# Patient Record
Sex: Female | Born: 1945 | Race: White | Hispanic: No | Marital: Married | Smoking: Never smoker
Health system: Southern US, Community
[De-identification: ages and names within clinical notes are randomized; demographics above are authoritative.]

## PROBLEM LIST (undated history)

## (undated) DIAGNOSIS — Z951 Presence of aortocoronary bypass graft: Secondary | ICD-10-CM

## (undated) DIAGNOSIS — E785 Hyperlipidemia, unspecified: Secondary | ICD-10-CM

## (undated) DIAGNOSIS — I10 Essential (primary) hypertension: Secondary | ICD-10-CM

## (undated) DIAGNOSIS — Z79899 Other long term (current) drug therapy: Secondary | ICD-10-CM

## (undated) DIAGNOSIS — R600 Localized edema: Secondary | ICD-10-CM

## (undated) DIAGNOSIS — I2510 Atherosclerotic heart disease of native coronary artery without angina pectoris: Secondary | ICD-10-CM

## (undated) DIAGNOSIS — Z8679 Personal history of other diseases of the circulatory system: Secondary | ICD-10-CM

## (undated) DIAGNOSIS — I25810 Atherosclerosis of coronary artery bypass graft(s) without angina pectoris: Secondary | ICD-10-CM

## (undated) DIAGNOSIS — G4733 Obstructive sleep apnea (adult) (pediatric): Secondary | ICD-10-CM

## (undated) HISTORY — DX: Atherosclerotic heart disease of native coronary artery without angina pectoris: I25.10

## (undated) HISTORY — DX: Personal history of other diseases of the circulatory system: Z86.79

## (undated) HISTORY — DX: Essential (primary) hypertension: I10

## (undated) HISTORY — DX: Obstructive sleep apnea (adult) (pediatric): G47.33

## (undated) HISTORY — DX: Localized edema: R60.0

## (undated) HISTORY — DX: Other long term (current) drug therapy: Z79.899

## (undated) HISTORY — DX: Atherosclerosis of coronary artery bypass graft(s) without angina pectoris: I25.810

## (undated) HISTORY — DX: Presence of aortocoronary bypass graft: Z95.1

## (undated) HISTORY — DX: Hyperlipidemia, unspecified: E78.5

---

## 1997-07-09 ENCOUNTER — Inpatient Hospital Stay (HOSPITAL_COMMUNITY): Admission: EM | Admit: 1997-07-09 | Discharge: 1997-07-11 | Payer: Self-pay | Admitting: Emergency Medicine

## 1997-09-11 ENCOUNTER — Ambulatory Visit (HOSPITAL_COMMUNITY): Admission: RE | Admit: 1997-09-11 | Discharge: 1997-09-11 | Payer: Self-pay | Admitting: Family Medicine

## 1997-09-23 ENCOUNTER — Ambulatory Visit (HOSPITAL_COMMUNITY): Admission: RE | Admit: 1997-09-23 | Discharge: 1997-09-23 | Payer: Self-pay | Admitting: Family Medicine

## 1997-10-04 ENCOUNTER — Ambulatory Visit (HOSPITAL_COMMUNITY): Admission: RE | Admit: 1997-10-04 | Discharge: 1997-10-04 | Payer: Self-pay | Admitting: Surgery

## 1998-03-28 ENCOUNTER — Encounter: Payer: Self-pay | Admitting: Family Medicine

## 1998-03-28 ENCOUNTER — Ambulatory Visit (HOSPITAL_COMMUNITY): Admission: RE | Admit: 1998-03-28 | Discharge: 1998-03-28 | Payer: Self-pay | Admitting: Family Medicine

## 1998-09-20 ENCOUNTER — Encounter: Payer: Self-pay | Admitting: Family Medicine

## 1998-09-20 ENCOUNTER — Ambulatory Visit (HOSPITAL_COMMUNITY): Admission: RE | Admit: 1998-09-20 | Discharge: 1998-09-20 | Payer: Self-pay | Admitting: Family Medicine

## 2000-09-16 ENCOUNTER — Other Ambulatory Visit: Admission: RE | Admit: 2000-09-16 | Discharge: 2000-09-16 | Payer: Self-pay | Admitting: Family Medicine

## 2000-10-06 ENCOUNTER — Encounter: Payer: Self-pay | Admitting: Family Medicine

## 2000-10-06 ENCOUNTER — Encounter: Admission: RE | Admit: 2000-10-06 | Discharge: 2000-10-06 | Payer: Self-pay | Admitting: *Deleted

## 2001-03-08 DIAGNOSIS — Z8679 Personal history of other diseases of the circulatory system: Secondary | ICD-10-CM

## 2001-03-08 DIAGNOSIS — I2510 Atherosclerotic heart disease of native coronary artery without angina pectoris: Secondary | ICD-10-CM

## 2001-03-08 HISTORY — DX: Atherosclerotic heart disease of native coronary artery without angina pectoris: I25.10

## 2001-03-08 HISTORY — DX: Personal history of other diseases of the circulatory system: Z86.79

## 2001-08-11 ENCOUNTER — Encounter: Payer: Self-pay | Admitting: Emergency Medicine

## 2001-08-12 ENCOUNTER — Inpatient Hospital Stay (HOSPITAL_COMMUNITY): Admission: EM | Admit: 2001-08-12 | Discharge: 2001-08-12 | Payer: Self-pay | Admitting: Emergency Medicine

## 2001-11-01 ENCOUNTER — Encounter: Admission: RE | Admit: 2001-11-01 | Discharge: 2001-11-01 | Payer: Self-pay | Admitting: Family Medicine

## 2001-11-01 ENCOUNTER — Encounter: Payer: Self-pay | Admitting: Family Medicine

## 2001-11-13 ENCOUNTER — Inpatient Hospital Stay (HOSPITAL_COMMUNITY): Admission: AD | Admit: 2001-11-13 | Discharge: 2001-11-19 | Payer: Self-pay | Admitting: Cardiology

## 2001-11-14 ENCOUNTER — Encounter: Payer: Self-pay | Admitting: Cardiothoracic Surgery

## 2001-11-15 ENCOUNTER — Encounter: Payer: Self-pay | Admitting: Cardiothoracic Surgery

## 2001-11-16 ENCOUNTER — Encounter: Payer: Self-pay | Admitting: Cardiothoracic Surgery

## 2001-11-17 ENCOUNTER — Encounter: Payer: Self-pay | Admitting: Cardiothoracic Surgery

## 2001-11-18 DIAGNOSIS — Z951 Presence of aortocoronary bypass graft: Secondary | ICD-10-CM

## 2001-11-18 HISTORY — DX: Presence of aortocoronary bypass graft: Z95.1

## 2001-11-19 ENCOUNTER — Encounter: Payer: Self-pay | Admitting: Cardiothoracic Surgery

## 2001-12-15 ENCOUNTER — Encounter: Admission: RE | Admit: 2001-12-15 | Discharge: 2001-12-15 | Payer: Self-pay | Admitting: Cardiothoracic Surgery

## 2001-12-15 ENCOUNTER — Encounter: Payer: Self-pay | Admitting: Cardiothoracic Surgery

## 2001-12-25 ENCOUNTER — Encounter (HOSPITAL_COMMUNITY): Admission: RE | Admit: 2001-12-25 | Discharge: 2002-03-25 | Payer: Self-pay | Admitting: Cardiology

## 2002-01-06 HISTORY — PX: CARDIAC CATHETERIZATION: SHX172

## 2002-01-06 HISTORY — PX: CORONARY ARTERY BYPASS GRAFT: SHX141

## 2002-07-19 ENCOUNTER — Ambulatory Visit (HOSPITAL_COMMUNITY): Admission: RE | Admit: 2002-07-19 | Discharge: 2002-07-19 | Payer: Self-pay | Admitting: Cardiology

## 2002-07-19 ENCOUNTER — Encounter: Payer: Self-pay | Admitting: Cardiology

## 2004-04-03 ENCOUNTER — Encounter: Admission: RE | Admit: 2004-04-03 | Discharge: 2004-04-03 | Payer: Self-pay | Admitting: Family Medicine

## 2005-02-22 ENCOUNTER — Other Ambulatory Visit: Admission: RE | Admit: 2005-02-22 | Discharge: 2005-02-22 | Payer: Self-pay | Admitting: Family Medicine

## 2005-08-28 ENCOUNTER — Emergency Department (HOSPITAL_COMMUNITY): Admission: EM | Admit: 2005-08-28 | Discharge: 2005-08-28 | Payer: Self-pay | Admitting: Emergency Medicine

## 2005-10-05 ENCOUNTER — Encounter: Admission: RE | Admit: 2005-10-05 | Discharge: 2005-10-05 | Payer: Self-pay | Admitting: Family Medicine

## 2005-12-09 ENCOUNTER — Encounter: Admission: RE | Admit: 2005-12-09 | Discharge: 2005-12-09 | Payer: Self-pay | Admitting: Family Medicine

## 2005-12-15 ENCOUNTER — Encounter: Admission: RE | Admit: 2005-12-15 | Discharge: 2005-12-15 | Payer: Self-pay | Admitting: Cardiology

## 2006-04-12 ENCOUNTER — Other Ambulatory Visit: Admission: RE | Admit: 2006-04-12 | Discharge: 2006-04-12 | Payer: Self-pay | Admitting: Family Medicine

## 2006-10-23 ENCOUNTER — Observation Stay (HOSPITAL_COMMUNITY): Admission: EM | Admit: 2006-10-23 | Discharge: 2006-10-25 | Payer: Self-pay | Admitting: Emergency Medicine

## 2006-11-29 ENCOUNTER — Encounter: Admission: RE | Admit: 2006-11-29 | Discharge: 2006-11-29 | Payer: Self-pay | Admitting: Family Medicine

## 2006-12-04 ENCOUNTER — Emergency Department (HOSPITAL_COMMUNITY): Admission: EM | Admit: 2006-12-04 | Discharge: 2006-12-04 | Payer: Self-pay | Admitting: Emergency Medicine

## 2007-04-14 ENCOUNTER — Encounter: Admission: RE | Admit: 2007-04-14 | Discharge: 2007-04-14 | Payer: Self-pay | Admitting: Family Medicine

## 2009-05-06 ENCOUNTER — Other Ambulatory Visit: Admission: RE | Admit: 2009-05-06 | Discharge: 2009-05-06 | Payer: Self-pay | Admitting: Family Medicine

## 2010-07-21 NOTE — Cardiovascular Report (Signed)
[name], [name]                  ACCOUNT NO.:  [id number]   MEDICAL RECORD NO.:  [id number]          PATIENT TYPE:  INP   LOCATION:  2006                         FACILITY:  [facility]   PHYSICIAN:  Ritta Slot, MD     DATE OF BIRTH:  18-Jul-1945   DATE OF PROCEDURE:  10/24/2006  DATE OF DISCHARGE:                            CARDIAC CATHETERIZATION   PROCEDURE PERFORMED:  1. Selective coronary angiography of the native coronary circulation      by the Judkins technique.  2. Retrograde left heart catheterization.  3. Left ventricular angiography.  4. Selective visualization of multiple saphenous vein grafts.  5. Selective visualization of the internal mammary artery.   COMPLICATIONS:  None.   ENTRY SITE:  Right femoral artery.   DYE USED:  Omnipaque.   PATIENT PROFILE:  This is a pleasant, 65 year old Caucasian female who  has a known history of coronary artery disease status post CABG in 2003  for the LIMA to LAD, vein graft to a diagonal,  vein graft to OM, who  comes in now complaining of chest pain over the weekend, that was  thought to be indigestion without any benefit with Tums.  It was felt to  proceed directly with cardiac catheterization for further evaluation of  the coronary artery.   RESULTS:  1. Pressure.  The left ventricular pressure was 147/21.  The aortic      pressure was 160/86.  There was no pullback gradient across the      aortic valve.  2. Angiographic results.      a.     The left main coronary artery appears normal.  The left       anterior descending artery contains a 90% stenosis in the proximal       portion of the vessel at the bifurcation of the first diagonal.       There is a TIMI-3 flow down the vessel and down the diagonal.      b.     The LIMA to the mid LAD is patent, with good flow distally,       and no evidence of any stenosis.      c.     The circumflex artery contains obtuse marginals with 100%       occlusion proximally.  There is a  vein graft to the distal       circumflex which supplies the circumflex and obtuse marginal       arteries, with good flow.  There is a 100% blocked saphenous vein       graft to the diagonal at the origin of the saphenous vein graft.       The right coronary artery is patent and free of disease, and is a       dominant vessel.  3. There is a left internal mammary artery graft that is widely patent      throughout.  It fills the LAD with good distal flow to the LAD.      The left ventricle shows mildly hypokinetic left ventricular  ejection fraction with an EF of around 40-50%, as well as a 2+ MR.   FINAL DIAGNOSIS:  Coronary artery disease.  Saphenous vein graft is  functional, with one saphenous vein graft to the diagonal occluded which  has been known since 2004, patent saphenous vein graft to an obtuse  marginal and  patent left internal mammary artery to the left anterior descending  artery, patent native right coronary artery, mild left ventricular  dysfunction with an ejection fraction of around 45-50%, 2+ mitral  regurgitation.  It is felt that this patient's chest pain is noncardiac  on this admission, and therefore she is to go home for bed rest.      Ritta Slot, MD  Electronically Signed     HS/MEDQ  D:  10/24/2006  T:  10/25/2006  Job:  [id number]

## 2010-07-21 NOTE — Discharge Summary (Signed)
NAMEKATHERENE, [name]                  ACCOUNT NO.:  [id number]   MEDICAL RECORD NO.:  [id number]          PATIENT TYPE:  INP   LOCATION:  2006                         FACILITY:  [facility]   PHYSICIAN:  Madaline Savage, M.D.DATE OF BIRTH:  03-26-1945   DATE OF ADMISSION:  10/23/2006  DATE OF DISCHARGE:  10/25/2006                               DISCHARGE SUMMARY   FINAL DIAGNOSES:  1. Chest pain, noncardiac.  2. Patent saphenous vein graft to obtuse marginal, patent LIMA to the      LAD, and patent native right coronary artery.  3. Saphenous vein graft to the diagonal was occluded which is      unchanged since 2004.  4. Decreased left ventricular function with ejection fraction 45 to      50%.  5. Two+ mitral regurgitation.  6. Hypertension.  7. Dyslipidemia.  8. Migraine headache.  9. Family history of coronary artery disease.   PROCEDURES:  Left heart cardiac catheterization and coronary arteriogram  were performed on October 23, 2006 by Dr. Ritta Slot.  It was noted  that one saphenous vein graft to the diagonal branch was occluded which  was unchanged since 2004.  There was a patent saphenous vein graft to an  obtuse marginal branch and patent left internal mammary artery to the  LAD.  The right coronary artery was patent as well.  LV function was  mildly reduced at 45 to 50% and there was 2+ mitral regurgitation.   COMPLICATIONS:  None.   BRIEF HISTORY:  [name] is a 65 year old female who presented to the  emergency department at [facility] on October 23, 2006 with complaint of  chest pain and burning; it was unrelieved with Tums; she did get mild  relief with sublingual nitroglycerin.  She was admitted with unstable  angina.  She ruled out for myocardial infarction.  Cardiac markers were  negative.  On October 24, 2006, she was taken to the cardiac  catheterization suite for left heart catheterization and coronary  arteriogram as well as visualization of grafts.  She  tolerated the  procedure without complications; thus, was returned to 2000.  She  experienced some bruising to the right groin and some mild bleeding to  the right groin and pressure was held to control that.  Her bedrest  would be complete at 10:00 p.m. and; thus, it was felt best to keep her  overnight and she was agreeable to that.  This morning, she is doing  well with no complaint and she is anxious for discharge.   DISCHARGE INSTRUCTIONS:  1. She is to keep the right groin site clean and dry and contact us      with bleeding, redness, swelling or edema at the site.  2. She may increase her activities slowly and she may shower later      today.  3. She is to avoid heavy lifting for one week and no driving for the      next 24 hours.   FOLLOWUP APPOINTMENTS:  She is to return to see Dr. Elsie Lincoln in four  months; our office will contact her with the specific date and time for  her appointment.   DISCHARGE MEDICATIONS:  1. Zocor 40 mg daily.  2. Lisinopril 10 mg daily.  3. Plavix 75 mg daily.  4. Imdur 30 mg daily.  5. Fish oil 1200 mg b.i.d.  6. Folic acid 400 mg two daily.  7. Toprol XL 50 mg daily.  8. Aspirin 81 mg daily.  9. Multivitamin daily.     ______________________________  Charmian Muff, NP    ______________________________  Madaline Savage, M.D.    LS/MEDQ  D:  10/25/2006  T:  10/25/2006  Job:  [id number]   cc:   [facility]  L. Lupe Carney, M.D.

## 2010-07-24 NOTE — Discharge Summary (Signed)
NAMEJEANET, [name] NO.:  [id number]   MEDICAL RECORD NO.:  [id number]                   PATIENT TYPE:  INP   LOCATION:  2040                                 FACILITY:  [facility]   PHYSICIAN:  Kerin Perna, M.D.               DATE OF BIRTH:  23-Feb-1946   DATE OF ADMISSION:  11/13/2001  DATE OF DISCHARGE:  11/19/2001                                 DISCHARGE SUMMARY   ADMISSION DIAGNOSIS:  Coronary artery disease with unstable angina.   PAST MEDICAL HISTORY:  1. History of chest pain, status post cardiac catheterization in 1999 which     revealed minimal coronary artery disease.  2. Hypertension.  3. Hyperlipidemia.  4. Mild obesity.   PAST SURGICAL HISTORY:  Carpal tunnel repair and tubal ligation.   ALLERGIES:  MOTRIN causes her throat to close.   DISCHARGE DIAGNOSIS:  Coronary artery disease with unstable angina, status  post coronary artery bypass graft.   HISTORY OF PRESENT ILLNESS:  The patient is a 65 year old Caucasian female.  In July of 2003, she sought medical attention because of a two to three-  month history of fatigue and chest pain.  At that time, it was thought that  her chest pain might be due to some costochondritis and she was treated with  NSAIDs.  Pain improved slightly, but did not completely resolve.  She then  underwent a Cardiolite stress test which was positive Bruce protocol and she  also had some chest pain during the walking phase.  She followed up with  [facility] on September 2.  She was still  experiencing some chest pain.  They recommended cardiac catheterization  which was performed on September 8, at [facility].  This  catheterization revealed two vessel coronary artery disease including a 70%  ostial left main lesion, and an occluded right coronary artery.  Dr. Elsie Lincoln  recommended transferring to [facility] for further treatment.   HOSPITAL COURSE:  On  September 8, the patient was admitted to [facility] under the care of Dr. Elsie Lincoln.  He requested CVTS consultation.  The  patient was evaluated by Dr. Donata Clay, after examination of the the patient  and review of the records including cardiac catheterization forms, Dr. Donata Clay recommended coronary artery bypass grafting for treatment.  The  procedure, risks, and benefits were discussed with the patient and she  agreed to proceed.   Doppler studies were performed on September 8, which revealed no significant  carotid artery disease and she had palpable pedal pulses bilaterally.   On September 9, the patient underwent an uncomplicated coronary artery  bypass grafting x3 by Dr. Donata Clay.  Grafts at the time of the procedure;  left internal mammary artery grafted to the left anterior descending artery,  saphenous vein grafted to the obtuse marginal artery, saphenous  vein grafted  to the diagonal artery. Vein was harvested from the right leg via the  Endovenous Harvesting technique.  She tolerated this procedure reasonably  well and was transferred in stable condition to the SICU.   The patient has remained hemodynamically stable since surgery and her  postoperative course has been uneventful.   The morning of September 15, she reports that she feels much better today.  Her vital signs are stable and she is afebrile.  Her heart has been  maintaining normal sinus rhythm.  Her lungs are clear.  Her bowel and  bladder function is within normal limits for her.  She is tolerating a  regular diet with no nausea.  Her incisions are all healing well and she has  no lower extremity edema.  Her weight is still several pounds over her  admission weight.  She is ambulating well in the hallway and her pain is  well controlled.  It is anticipated that if she continues to make good  progress in the next 24 hours, she will be ready for discharge tomorrow  morning, November 19, 2001.    CONDITION ON DISCHARGE:  Improved.   DISCHARGE INSTRUCTIONS:  She has been asked to refrain from any driving or  any heavy lifting, pushing, or pulling.  She has also been asked to continue  her breathing exercises and daily walking.  Diet should be low fat, low salt  diet.   WOUND CARE:  She may shower with mild soap and water.  She has been asked to  inspect her incisions daily.  If they become red, hot, swollen, draining, or  if she has a fever greater than 101 degrees Fahrenheit, she is to call Dr.  Zenaida Niece Trigt's office.   DISCHARGE MEDICATIONS:  1. Ultram 50 mg one to two p.o. q.4-6h. p.r.n. for pain.  2. Lasix 40 mg p.o. q.d. x10 days.  3. Potassium chloride 20 mEq p.o. q.d. x10 days.  4. Inderal LA 80 mg p.o. q.d.  5. Lipitor 20 mg p.o. q.d.  6. Enteric-coated aspirin 325 mg p.o. q.d.   FOLLOW UP:  1. Dr. Elsie Lincoln would like to see her in his office in approximately two     weeks.  She has been asked to call [facility]     for an appointment.  2. She will see Dr. Donata Clay at the CVTS Office in approximately three     weeks.  The office will call with date and time for that appointment.     She will be asked to have a chest x-ray at [facility] one hour prior to that     appointment.     Toribio Harbour, R.N.                  Kerin Perna, M.D.    CTK/MEDQ  D:  11/18/2001  T:  11/20/2001  Job:  [id number]   cc:   L. [name] Carney, M.D.   Madaline Savage, M.D.  [address], [address]  [place]  [place] [address]  Fax: [phone]

## 2010-07-24 NOTE — Op Note (Signed)
[name], [name] NO.:  [id number]   MEDICAL RECORD NO.:  [id number]                   PATIENT TYPE:  INP   LOCATION:  2303                                 FACILITY:  [facility]   PHYSICIAN:  Kerin Perna III, M.D.           DATE OF BIRTH:  1945-10-23   DATE OF PROCEDURE:  11/14/2001  DATE OF DISCHARGE:                                 OPERATIVE REPORT   PREOPERATIVE DIAGNOSES:  Class IV unstable angina with left main and two-  vessel coronary disease.   POSTOPERATIVE DIAGNOSES:  Class IV unstable angina with left main and two-  vessel coronary disease.   OPERATION PERFORMED:  Coronary artery bypass grafting times three (left  internal mammary artery to the left anterior descending, saphenous vein  graft to diagonal, saphenous vein graft to circumflex marginal).   SURGEON:  Kerin Perna, M.D.   ASSISTANT:  Levin Erp. Steward, P.A.   ANESTHESIA:  General.   ANESTHESIOLOGIST:  Edwin Cap. Zoila Shutter, M.D.   INDICATIONS FOR PROCEDURE:  The patient is a 65 year old female with a  history of chest pain.  She had been evaluated by Cardiolite scan which was  nonconclusive and had persistent chest pain.  She underwent cardiac  catheterization by Dr. Elsie Lincoln which demonstrated ostial 75% stenosis and  total occlusion of the distal circumflex marginal.  She was felt to be a  candidate for surgical coronary revascularization.  Prior to surgery, I  examined the patient in her hospital room in the CCU following cardiac  catheterization.  I discussed with the patient the indications and expected  benefits of coronary artery bypass surgery for treatment of her coronary  artery disease.  I reviewed with the patient the alternatives to surgical  therapy for treatment of her coronary artery disease.  I discussed with the  patient the major aspects of the proposed operation including the location  of the surgical incisions, the choice of conduit for grafting, the  use of  general anesthesia and cardiopulmonary bypass, and the expected  postoperative hospital recovery.  I discussed with the patient the risks to  her coronary artery bypass surgery including the risks of myocardial  infarction, cerebrovascular accident, bleeding, infection, blood transfusion  requirement and death.  She understood these implications for the surgery  and agreed to proceed with the operation as planned under what I felt was an  informed consent.   OPERATIVE FINDINGS:  The patient's mammary artery is very small and had to  be shortened in order to be usable.  It was placed to the proximal mid LAD.  The saphenous vein was harvested from the right thigh using endoscopic vein  technique.  The diagonal was a small vessel with proximal disease.  The  circumflex marginal was a 1.5 mm vessel that was chronically occluded.  Overall ventricular function looked good.  Due to the patient 's body  habitus.  Exposure of the posterior aspect of the heart was difficult.   DESCRIPTION OF PROCEDURE:  The patient was brought to the operating room and  placed supine on the operating room table where general anesthesia was  induced under invasive hemodynamic monitoring.  The chest, abdomen and legs  were prepped with Betadine and draped as a sterile field.  A sternal  incision was made and the saphenous vein was harvested from the right thigh  using endoscopic technique.  The left internal mammary artery was harvested  as a pedicle graft from its origin at the subclavian vessels.  Heparin was  administered and the ACT was documented as being therapeutic.  Through  pursestrings placed in the ascending aorta and right atrium, the patient was  cannulated, placed on bypass and cooled to 32 degrees.  The coronaries were  identified for grafting and the mammary artery and vein grafts were prepared  for the distal anastomoses.  A cardioplegia cannula was placed and the  patient was cooled to 30  degrees.  As the aortic crossclamp was applied, 500  cc of cold blood cardioplegia was delivered to the aortic root with good  cardioplegic arrest and septal temperature dropping less than 12 degrees.  Topical iced saline slush was used to augment myocardial preservation and a  pericardial insulator pad was used to protect the left phrenic nerve.   The distal coronary anastomoses were then performed.  The first distal  anastomosis was to the high first diagonal.  This was a  1.2 mm vessel with proximal 75 to 80% stenosis and a reversed saphenous vein  was sewn end-to-side with a running 7-0 Prolene.  The second distal  anastomosis was to the occluded circumflex marginal.  This was a 1.5 mm  vessel and the reversed saphenous vein was sewn end-to-side with running 7-0  Prolene.  Cardioplegia was redosed.  The third distal anastomosis was to the  midportion of the LAD, which was a 1.5 mm vessel with proximal 80% stenosis  at the left main.  The left internal mammary artery pedicle was brought  through an opening created in the left lateral pericardium and was brought  down onto the LAD and sewn end-to-side with running 8-0 Prolene.  There was  good flow through the anastomosis with immediate rise in septal temperature  after release of the pedicle clamp on the mammary artery.  The mammary  pedicle was secured to the epicardium and the aortic crossclamp was removed.   The heart resumed a spontaneous rhythm.  Using a partial occlusion clamp,  two proximal vein anastomoses were placed on the ascending aorta using a 4.0  mm punch and running 6-0 Prolene.  The partial clamp was removed and the  vein grafts were perfused.  Each had good flow and hemostasis was documented  at the proximal and distal sites.  The patient was rewarmed and reperfused.  Temporary pacing wires were applied.  The lungs were re-expanded and the  ventilator was resumed.  The patient was weaned from bypass without inotropes  with stable blood pressure and good cardiac output.  Protamine was  administered without adverse reaction.  The cannulas were removed.  The  mediastinum was irrigated with warm antibiotic irrigation.  The leg incision  was irrigated and closed in a standard fashion.  The pericardium was loosely  reapproximated.  Two mediastinal and a left pleural chest tube were placed  and brought out through separate incisions.  The sternum was closed with  interrupted  steel wire.  The pectoralis fascia was closed with interrupted  #1 Vicryl.  The subcutaneous layer and skin were closed with a running  Vicryl.  The patient transferred to the ICU in stable condition.  Total  bypass time was 90 minutes with an aortic crossclamp time of 33 minutes.                                                 Mikey Bussing, M.D.    PV/MEDQ  D:  11/14/2001  T:  11/15/2001  Job:  [id number]   cc:   Madaline Savage, M.D.  [address]  [place]  [place] [address]  Fax: [phone]

## 2010-07-24 NOTE — H&P (Signed)
[place]  Patient:    [name], [name] Visit Number: [id number] MRN: [id number]          Service Type: EMS Location: [facility] Attending Physician:  Hanley Seamen Dictated by:   Desma Maxim, M.D. Admit Date:  08/11/2001                           History and Physical  CHIEF COMPLAINT: Chest pain.  HISTORY OF PRESENT ILLNESS: The patient is a 65 year old white female, a patient of Dr. Clovis Riley, who presented to the emergency room with about a six hour history of substernal chest pain radiating up to the neck, primarily sharp in nature but did have some dull component.  No shortness of breath, diaphoresis, or palpitations.  No arm pain.  Because of the persistence of the pain an ambulance was called and she was given three nitroglycerin there.  No significant relief.  Transported to the emergency room and here she received three additional nitroglycerin with some relief, but she says not total, though the chart said she did get relief with the nitroglycerin.  Because of the continued pain and nature of the pain she is being admitted for rule out MI.  Of note, the patient was admitted five years ago for a similar episode, saw Dr. Elsie Lincoln and had catheterization done at that time which was unremarkable.  Had gallbladder looked at, was also unremarkable.  Does have risk factors of father with heart disease in his 9s.  She herself has no other risk factors.  Also did have upper GI in the past couple of years which showed a small hiatal hernia.  Again, the patient is admitted for further evaluation.  PAST MEDICAL HISTORY:  1. Significant for migraines, on Inderal LA 60 mg q.d.  2. Irritable bowel, Remus Loffler p.r.n..  3. She is gravida 2 para 2 A 0.  PAST SURGICAL HISTORY: No surgical history.  SOCIAL HISTORY: Nonsmoker.  ALLERGIES: MOTRIN.  PHYSICAL EXAMINATION:  VITAL SIGNS: BP 130/74, pulse 70 and regular, respirations 16.  GENERAL:  NAD.  HEENT: TMs and canals clear.  PERRL.  EOMI.  Discs flat.  Nose and throat clear.  NECK: Supple.  No adenopathy, JVD, bruits, or enlarged thyroid.  CHEST: Clear, nontender.  CARDIAC: RR, without MGCR.  ABDOMEN: Normal BS, soft, nontender.  RECTAL/PELVIC: Not done.  That is current as of this year.  LABORATORY DATA: EKG, no acute changes.  An i-STAT hemoglobin normal.  CK normal with a negative MB.  Negative troponin.  ASSESSMENT: Chest pain, suspect noncardiac; probable gastrointestinal.  PLAN:  1. Observe overnight.  2. Repeat enzymes.  3. Begin on PPI. Dictated by:   Desma Maxim, M.D. Attending Physician:  Hanley Seamen DD:  08/12/01 TD:  08/14/01 Job: [id number]

## 2010-07-24 NOTE — Cardiovascular Report (Signed)
NAME:  [name], [name]                            ACCOUNT NO.:  [id number]   MEDICAL RECORD NO.:  [id number]                   PATIENT TYPE:  OIB   LOCATION:  2859                                 FACILITY:  [facility]   PHYSICIAN:  Madaline Savage, M.D.             DATE OF BIRTH:  08-26-1945   DATE OF PROCEDURE:  DATE OF DISCHARGE:  07/19/2002                              CARDIAC CATHETERIZATION   PROCEDURES PERFORMED:  1. Selective coronary angiography by Judkins technique.  2. Retrograde left heart catheterization.  3. Left ventricular angiography.  4. Selective visualization of multiple saphenous vein grafts.  5. Selective visualization of the left internal mammary artery graft.   COMPLICATIONS:  None.   ENTRY SITE:  Right femoral.   DYE USE:  Omnipaque.   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  Recently, she has had chest pain that has sounded anginal in nature.  She has not had a Cardiolite stress test.  Instead, we decided to come to  the catheterization lab to evaluate symptoms since they were fairly  classical.  Her outpatient medical regimen includes Lipitor 20 mg daily,  Inderal LA 120 mg daily, aspirin 81 mg daily, folic acid twice a day,  Diovan/HCT 160/25 mg one daily.   ALLERGIES:  MOTRIN which causes anaphylaxis.   Today's outpatient procedure was performed in the [facility] Catheterization  Lab electively.   RESULTS:  PRESSURES:  The left ventricular pressure was 125/5, end-diastolic  pressure 20.  Central aortic pressure 125/60, mean of 91. No aortic valve  gradient by pullback technique.   ANGIOGRAPHIC RESULTS:  1. Left main coronary artery:  70% ostial stenosis. The diagnostic 6 French  catheter did damp pressure when entering the vessel.  2. Left anterior descending coronary artery:  Patent throughout most of its     course except for a 75% stenosis just proximal to the left internal     mammary artery insertion site.  Distal runoff is good.  3. The diagonal branch of LAD is patent.  However, the graft to this vessel     is occluded as will be discussed later.  4. Proximal left circumflex:  100% occluded proximal.  5. Right coronary artery:  Normal.  6. Internal mammary artery graft to LAD widely patent with good runoff.  7. Saphenous vein graft to diagonal 100% occluded at the proximal     anastomotic site with a thin hair line lumen throughout the course of the     vessel.  Essentially no flow in an antegrade fashion to the diagonal.  8. Patent saphenous vein graft to obtuse marginal branch #1 with  good distal     runoff.  9. Supranormal LV function, ejection fraction 65-70%.  10.      Trivial to mild mitral  regurgitation.   PLAN:  The patient's medical therapy should be intensified.  If she has  refractory angina, which does not seem likely, EECP would be a  consideration.  Catheter based technologies do not apply here due to the  thread like lumen and coronary artery bypass grafting does not seem  warranted.                                               Madaline Savage, M.D.    WHG/MEDQ  D:  07/19/2002  T:  07/20/2002  Job:  [id number]   cc:   L. Lupe Carney, M.D.  [address] [place]  [place] [address]  Fax: [phone]   [facility]   Kathlee Nations Suann Larry, M.D.  [address]  [place]  [place] [address]  Fax: [phone]

## 2010-12-18 LAB — CBC
Hemoglobin: 14.9
MCHC: 33.6
MCV: 95.3
RDW: 13
RDW: 13.4
WBC: 6.5
WBC: 6.6

## 2010-12-18 LAB — DIFFERENTIAL
Basophils Absolute: 0
Basophils Relative: 0
Eosinophils Relative: 1
Lymphs Abs: 1.6
Monocytes Absolute: 0.4
Neutro Abs: 4.4
Neutrophils Relative %: 68

## 2010-12-18 LAB — COMPREHENSIVE METABOLIC PANEL
CO2: 33 — ABNORMAL HIGH
Calcium: 9.5
Creatinine, Ser: 0.71
Glucose, Bld: 86
Sodium: 143

## 2010-12-18 LAB — I-STAT 8, (EC8 V) (CONVERTED LAB)
Acid-Base Excess: 6 — ABNORMAL HIGH
BUN: 14
Bicarbonate: 31.8 — ABNORMAL HIGH
HCT: 46
Operator id: 257131
Potassium: 4.6
Sodium: 138
TCO2: 33

## 2010-12-18 LAB — LIPID PANEL
Cholesterol: 172
HDL: 52
LDL Cholesterol: 90
VLDL: 30

## 2010-12-18 LAB — CK TOTAL AND CKMB (NOT AT ARMC)
CK, MB: 1.3
Relative Index: INVALID
Total CK: 56

## 2010-12-18 LAB — POCT CARDIAC MARKERS
CKMB, poc: <1 — ABNORMAL LOW
Myoglobin, poc: 58.2
Operator id: 257131
Troponin i, poc: <0.05

## 2010-12-18 LAB — APTT: aPTT: 33

## 2010-12-18 LAB — BASIC METABOLIC PANEL
CO2: 29
Calcium: 8.9
Chloride: 103
Creatinine, Ser: 0.82
Sodium: 139

## 2010-12-18 LAB — TROPONIN I
Troponin I: 0.01
Troponin I: 0.01
Troponin I: 0.1 — ABNORMAL HIGH

## 2010-12-18 LAB — PROTIME-INR
INR: 0.9
Prothrombin Time: 12.4

## 2010-12-18 LAB — TSH: TSH: 1.369

## 2010-12-18 LAB — HEMOGLOBIN A1C: Hgb A1c MFr Bld: 6.2 — ABNORMAL HIGH

## 2010-12-18 LAB — HEPARIN LEVEL (UNFRACTIONATED): Heparin Unfractionated: 0.7

## 2011-03-09 HISTORY — PX: TRANSTHORACIC ECHOCARDIOGRAM: SHX275

## 2011-04-05 ENCOUNTER — Other Ambulatory Visit: Payer: Self-pay | Admitting: Family Medicine

## 2011-04-05 ENCOUNTER — Other Ambulatory Visit (HOSPITAL_COMMUNITY)
Admission: RE | Admit: 2011-04-05 | Discharge: 2011-04-05 | Disposition: A | Discharge status: Home / Self Care | Payer: Medicare Other | Source: Ambulatory Visit | Attending: Family Medicine | Admitting: Family Medicine

## 2011-04-05 DIAGNOSIS — Z124 Encounter for screening for malignant neoplasm of cervix: Secondary | ICD-10-CM | POA: Insufficient documentation

## 2012-02-22 ENCOUNTER — Other Ambulatory Visit (HOSPITAL_COMMUNITY): Payer: Self-pay | Admitting: Cardiovascular Disease

## 2012-02-22 DIAGNOSIS — I6529 Occlusion and stenosis of unspecified carotid artery: Secondary | ICD-10-CM

## 2012-07-20 ENCOUNTER — Telehealth: Payer: Self-pay | Admitting: Cardiovascular Disease

## 2012-07-20 NOTE — Telephone Encounter (Signed)
Mrs. Eckstrom says that the medication Amlodipine is making her legs swell very badly and she would like to come off of this medication. Please call her at [phone]

## 2012-07-20 NOTE — Telephone Encounter (Signed)
Patient states she has been taking amlodipine for 1 month. She now has some ankle swelling. Recommended that she hold taking the medication until I consult with Dr. Alanda Amass. Note deferred to MD for advice.

## 2012-07-21 ENCOUNTER — Telehealth: Payer: Self-pay | Admitting: Cardiovascular Disease

## 2012-07-21 NOTE — Telephone Encounter (Signed)
[name] is because she has not heard anything today about whether or not she should continue with her Amlodipine 5mg  please call. If calling before 5pm please call [phone] and if after 5pm please call her cell @ [phone].  Thanks

## 2012-07-21 NOTE — Telephone Encounter (Signed)
CHART TAKEN  TO TRIAGE. CR

## 2012-07-26 ENCOUNTER — Telehealth: Payer: Self-pay | Admitting: Cardiovascular Disease

## 2012-07-26 NOTE — Telephone Encounter (Signed)
[name] needs a prescription refill on; Klor-con m20.Marland Kitchen [facility]..  Thanks

## 2012-07-28 MED ORDER — POTASSIUM CHLORIDE CRYS ER 20 MEQ PO TBCR: 20.0000 meq | EXTENDED_RELEASE_TABLET | Freq: Every day | ORAL | Status: DC

## 2012-07-28 NOTE — Telephone Encounter (Signed)
Refills sent to pharmacy. 

## 2012-08-30 ENCOUNTER — Other Ambulatory Visit: Payer: Self-pay

## 2012-08-30 MED ORDER — CLOPIDOGREL BISULFATE 75 MG PO TABS: 75.0000 mg | ORAL_TABLET | Freq: Every day | ORAL | Status: DC

## 2012-08-30 NOTE — Telephone Encounter (Signed)
Rx was sent to pharmacy electronically via [employer].  

## 2012-09-05 HISTORY — PX: OTHER SURGICAL HISTORY: SHX169

## 2012-09-12 ENCOUNTER — Other Ambulatory Visit: Payer: Self-pay

## 2012-09-12 MED ORDER — ROSUVASTATIN CALCIUM 5 MG PO TABS: 5.0000 mg | ORAL_TABLET | Freq: Every day | ORAL | Status: DC

## 2012-09-12 NOTE — Telephone Encounter (Signed)
Rx was sent to pharmacy electronically via [employer].  

## 2012-09-22 ENCOUNTER — Ambulatory Visit (HOSPITAL_COMMUNITY)
Admission: RE | Admit: 2012-09-22 | Discharge: 2012-09-22 | Disposition: A | Discharge status: Home / Self Care | Payer: Medicare Other | Source: Ambulatory Visit | Attending: Cardiovascular Disease | Admitting: Cardiovascular Disease

## 2012-09-22 DIAGNOSIS — I6529 Occlusion and stenosis of unspecified carotid artery: Secondary | ICD-10-CM | POA: Insufficient documentation

## 2012-09-22 NOTE — Progress Notes (Signed)
Carotid Duplex Completed. Nyjae Hodge, RDMS, RVT  

## 2013-04-12 ENCOUNTER — Telehealth: Payer: Self-pay | Admitting: Cardiology

## 2013-04-12 MED ORDER — LOSARTAN POTASSIUM 100 MG PO TABS
100.0000 mg | ORAL_TABLET | Freq: Every day | ORAL | Status: DC
Start: 2013-04-12 — End: 2013-05-14

## 2013-04-12 NOTE — Telephone Encounter (Signed)
Returned call and pt verified x 2.  Pt informed message received and 90-day refill will be sent.  Also informed must keep appt for more refills.  Pt verbalized understanding and agreed w/ plan.  Refill(s) sent to pharmacy.

## 2013-04-12 NOTE — Telephone Encounter (Signed)
Former Dr.Weintraub patient ... Will see Dr.Harding in March and needs her prescription for Losartan Tab 100mg  [facility] , they said they have been faxing the request over .Marland Kitchen. Please Call    Thanks

## 2013-05-14 ENCOUNTER — Ambulatory Visit (INDEPENDENT_AMBULATORY_CARE_PROVIDER_SITE_OTHER): Payer: Medicare Other | Admitting: Cardiology

## 2013-05-14 ENCOUNTER — Encounter: Payer: Self-pay | Admitting: Cardiology

## 2013-05-14 ENCOUNTER — Other Ambulatory Visit: Payer: Self-pay | Admitting: Cardiology

## 2013-05-14 VITALS — BP 156/92 | HR 79 | Ht 63.0 in | Wt 164.3 lb

## 2013-05-14 DIAGNOSIS — R609 Edema, unspecified: Secondary | ICD-10-CM

## 2013-05-14 DIAGNOSIS — R600 Localized edema: Secondary | ICD-10-CM

## 2013-05-14 DIAGNOSIS — I10 Essential (primary) hypertension: Secondary | ICD-10-CM

## 2013-05-14 DIAGNOSIS — Z951 Presence of aortocoronary bypass graft: Secondary | ICD-10-CM

## 2013-05-14 DIAGNOSIS — E785 Hyperlipidemia, unspecified: Secondary | ICD-10-CM

## 2013-05-14 DIAGNOSIS — Z8679 Personal history of other diseases of the circulatory system: Secondary | ICD-10-CM

## 2013-05-14 DIAGNOSIS — I2510 Atherosclerotic heart disease of native coronary artery without angina pectoris: Secondary | ICD-10-CM

## 2013-05-14 DIAGNOSIS — I25810 Atherosclerosis of coronary artery bypass graft(s) without angina pectoris: Secondary | ICD-10-CM

## 2013-05-14 NOTE — Patient Instructions (Signed)
You seem to be doing OK -- mild swelling & your BP is up still..  I will go ahead & check your cholesterol levels & chemistries (so you have them when you see Dr. Clovis RileyMitchell). -- I would like to change your Losartan & Losartan/HCTZ to a new combination med like Avalide (irbesartan/HCTZ). -- will put this in my note for Dr. Clovis RileyMitchell.  I recommend wearing support stockings to help the swelling.  Marykay LexHARDING,DAVID W, MD  Your physician wants you to follow-up in: 6 monhts. You will receive a reminder letter in the mail two months in advance. If you don't receive a letter, please call our office to schedule the follow-up appointment.

## 2013-05-16 ENCOUNTER — Encounter: Payer: Self-pay | Admitting: Cardiology

## 2013-05-16 DIAGNOSIS — R600 Localized edema: Secondary | ICD-10-CM | POA: Insufficient documentation

## 2013-05-16 DIAGNOSIS — Z8679 Personal history of other diseases of the circulatory system: Secondary | ICD-10-CM | POA: Insufficient documentation

## 2013-05-16 DIAGNOSIS — E785 Hyperlipidemia, unspecified: Secondary | ICD-10-CM | POA: Insufficient documentation

## 2013-05-16 DIAGNOSIS — I10 Essential (primary) hypertension: Secondary | ICD-10-CM | POA: Insufficient documentation

## 2013-05-16 DIAGNOSIS — I25810 Atherosclerosis of coronary artery bypass graft(s) without angina pectoris: Secondary | ICD-10-CM | POA: Insufficient documentation

## 2013-05-16 HISTORY — DX: Localized edema: R60.0

## 2013-05-16 LAB — COMPREHENSIVE METABOLIC PANEL
ALBUMIN: 4.1 g/dL (ref 3.5–5.2)
ALK PHOS: 49 U/L (ref 39–117)
ALT: 14 U/L (ref 0–35)
AST: 13 U/L (ref 0–37)
BILIRUBIN TOTAL: 0.5 mg/dL (ref 0.2–1.2)
BUN: 15 mg/dL (ref 6–23)
CO2: 32 mEq/L (ref 19–32)
Calcium: 8.9 mg/dL (ref 8.4–10.5)
Chloride: 104 mEq/L (ref 96–112)
Creat: 0.74 mg/dL (ref 0.50–1.10)
GLUCOSE: 97 mg/dL (ref 70–99)
POTASSIUM: 3.9 meq/L (ref 3.5–5.3)
SODIUM: 144 meq/L (ref 135–145)
TOTAL PROTEIN: 6 g/dL (ref 6.0–8.3)

## 2013-05-16 LAB — CHOLESTEROL, TOTAL: Cholesterol: 155 mg/dL (ref 0–200)

## 2013-05-16 NOTE — Assessment & Plan Note (Signed)
He had progression of her native left main disease 2008 - but with patent LIMA-LAD and SVG-OM1. She remains on Plavix and aspirin for preventative therapy along with ARB beta blocker and low-dose statin. No active ischemic symptoms.

## 2013-05-16 NOTE — Assessment & Plan Note (Signed)
With mild skin discoloration and trace edema bilaterally, this is probably more consistent with venous insufficiency. At this time is not really overly symptomatic to her. I recommended support stockings.

## 2013-05-16 NOTE — Assessment & Plan Note (Signed)
Last Myoview was in 2012 and no evidence of ischemia. She will be due for one next year. He has had at least one graft fail, which may have been because of sufficient antegrade flow for the native diagonal through the left main with lack of flow down the distal LAD that was grafted.

## 2013-05-16 NOTE — Assessment & Plan Note (Signed)
I am not sure when the last time she's had her labs checked. She is on Crestor which she sees be doing relatively well on.   Plan: Check cholesterol panel and CMP.

## 2013-05-16 NOTE — Assessment & Plan Note (Signed)
No anginal or heart failure symptoms. Negative Myoview in 2012

## 2013-05-16 NOTE — Progress Notes (Signed)
PATIENT: [name] MRN: [id number] DOB: 29-Apr-1945 PCP: Pamela Coffin, MD  Clinic Note: Chief Complaint  Patient presents with  . 6 month RAW to [facility]    occ lightheadedness/dizziness; reports bilat LE edema in summer only    HPI: [name] is a 68 y.o. female with a PMH below who presents today for establishment of new cardiology care following the retirement of [name].  Her cardiac history dates back to 2003 when she was evaluated for unstable angina by Dr. Janene Webster. She was taken to the Cath Lab to have left main disease. She subsequently underwent three-vessel CABG. One year later he noted it was closed. Otherwise the native right and the 2 grafts are widely patent. Over the course of 4 years another catheterization showed progression of left main disease to 90%. She is a has had a Myoview in 2012 that showed no evidence of ischemia and the relatively of normal echo in 2013, despite the fact that her cath in 2008 showed mild MR/2+ with EF of 40-50%, but followup evaluation that showed normalization of the EF.  Dr. Rollene Webster and plan to start her on amlodipine during her last visit, however she stopped taking it because of edema.  Interval History:  She now presents for almost annual followup. From a cardiac standpoint she seems to be doing very well no further episodes of chest tightness or pressure with restoration of her shin. She is always on the go and active without any major point to concerns. No exertional dyspnea unless she really pushes it was expected. She denies any PND orthopnea, but does have edema more so in the summer time in the winter. She does note some mild discoloration of her lower extremities and was thinking she had a rash. She denies any rapid or irregular heartbeats, no lightheadedness, dizziness and wooziness or syncope/near syncope. No TIA or amaurosis fugax symptoms. No melena, hematochezia or hematuria. No epistaxis. No claudication. He denies any  headaches or blurred vision this, nausea or fatigue. No orthostatic symptoms.  Past Medical History  Diagnosis Date  . H/O unstable angina  2003    Cardiac cath with multivessel disease referred for CABG  . Left main coronary artery disease 2003    70% ostial left main, 100% Cx,; patent, normal RCA  . 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 CAD (coronary artery disease) of bypass graft 2004/2008    04 cath revealed occluded SVG-D1   . Hypertension, benign   . Hyperlipidemia LDL goal <70     Prior Cardiac Evaluation and Past Surgical History: Past Surgical History  Procedure Laterality Date  . Cardiac catheterization   Novembeer 2003    70% left main, 100 % Cx @ OM; normal RCA  . Coronary artery bypass graft  November 2003    Dr. Cyndia Webster: LIMA-LAD, SVG-OM 1, SVG-D1  . Cardiac catheterization  2004, 2008    Occluded SVG-D1 with antegrade native T1 flow; LM increased from 70-90% with patent LIMA and SVG-OM1; patent RCA ; EF of 45-50% with 2+ MR   . Nm myoview ltd  2012    No evidence of ischemia, normal EF  . Transthoracic echocardiogram  2013    Normal LV function with normal valves.  . Carotid duplex ultrasound  July 2014    Less than 49% right carotid; relatively normal left carotid.    Allergies  Allergen Reactions  . Etodolac Anaphylaxis    migraines  .  Motrin [Ibuprofen] Swelling    Throat swells  . Statins     Myalgias     Current Outpatient Prescriptions  Medication Sig Dispense Refill  . aspirin 81 MG tablet Take 81 mg by mouth daily.      . Cholecalciferol (VITAMIN D-3) 1000 UNITS CAPS Take 2 capsules by mouth daily.      . clopidogrel (PLAVIX) 75 MG tablet Take 1 tablet (75 mg total) by mouth daily.  90 tablet  3  . fexofenadine (ALLEGRA) 180 MG tablet Take 180 mg by mouth daily as needed for allergies or rhinitis.      . folic acid (FOLVITE) 440 MCG tablet Take 400 mcg by mouth daily.      Marland Kitchen losartan (COZAAR) 100 MG tablet Take 100 mg by  mouth at bedtime. Keep appointment for refills.      Marland Kitchen losartan-hydrochlorothiazide (HYZAAR) 100-25 MG per tablet Take 1 tablet by mouth every morning.      . Magnesium 250 MG TABS Take 1 tablet by mouth daily.      . Multiple Vitamin (MULTIVITAMIN) capsule Take 1 capsule by mouth daily.      . Omega-3 Fatty Acids (FISH OIL) 1200 MG CAPS Take 2 capsules by mouth daily.      . potassium chloride SA (KLOR-CON M20) 20 MEQ tablet Take 1 tablet (20 mEq total) by mouth daily.  90 tablet  3  . rosuvastatin (CRESTOR) 5 MG tablet Take 1 tablet (5 mg total) by mouth daily.  90 tablet  3  . TOPROL XL 50 MG 24 hr tablet Take 1 tablet by mouth daily.       No current facility-administered medications for this visit.    History   Social History Narrative   68 year old married white woman, mother of 51, grandmother of 42.   Never smoked. Rare social alcohol.   She is a Nature conservation officer around the house, and does lots of the chart work.   Her husband is also a 4 patient Dr. Rollene Webster, and now follows up with Dr. Ellyn Webster.   Family History: family history is not on file. no premature CAD or cardiac death.  ROS: A comprehensive Review of Systems - Negative except Intermittent episodes of leg cramping, mild motion edema with skin discoloration.  PHYSICAL EXAM BP 156/92  Pulse 79  Ht 5' 3"  (1.6 m)  Wt 164 lb 4.8 oz (74.526 kg)  BMI 29.11 kg/m2 General appearance: alert, cooperative, appears stated age, no distress and Normal mood and affect. Answers questions appropriately. Healthy-appearing Neck: no adenopathy, no carotid bruit, no JVD, supple, symmetrical, trachea midline and thyroid not enlarged, symmetric, no tenderness/mass/nodules Lungs: clear to auscultation bilaterally, normal percussion bilaterally and Nonlabored, good air movement Heart: RRR, normal S1-S2. Soft S4. 1/6 HSM at left lower sternal border/apex. No other R./G. No HJR Abdomen: soft, non-tender; bowel sounds normal; no masses,  no  organomegaly Extremities: extremities normal, atraumatic, no cyanosis; trace right and 1+ left lower extremity edema with mild varicose veins noted and mild venous stasis dermatitis noted Pulses: 2+ and symmetric Neurologic: Alert and oriented X 3, normal strength and tone. Normal symmetric reflexes. Normal coordination and gait  [id number] today: Yes Rate: 79 , Rhythm: NSR; essentially normal  EKG;    Recent Labs: None available  ASSESSMENT / PLAN: Relatively healthy middle-aged woman with a history of CABG presenting to reestablish cardiology care. She terminate her current issue admit now is currently related to hypertension. She has mild lower extremity edema is well  with some venous stasis discoloration of the legs.  Left main coronary artery disease - 90% He had progression of her native left main disease 2008 - but with patent LIMA-LAD and SVG-OM1. She remains on Plavix and aspirin for preventative therapy along with ARB beta blocker and low-dose statin. No active ischemic symptoms.  S/P CABG x 3;  LIMA-LAD, SVG-OM1, SVG-D1 (occluded) Last Myoview was in 2012 and no evidence of ischemia. She will be due for one next year. He has had at least one graft fail, which may have been because of sufficient antegrade flow for the native diagonal through the left main with lack of flow down the distal LAD that was grafted.  Hypertension, benign This is currently the primary issue with inadequate control. She says this is basically her baseline pressures. I am not really sure why she is on a combination of 200 mg of losartan/25 mg HCTZ. My inclination if her blood pressure is not controlled when she sees her PCP next month would be to consider switching her to irbesartan 300/12.5 avoid basically pushing a weak ARB beyond its capability.  Another potential change in the future could be switching from Toprol to carvedilol for more antihypertensive effect.  She has been tried on amlodipine but she  stopped taking it secondary to edema.  Hyperlipidemia LDL goal <70 I am not sure when the last time she's had her labs checked. She is on Crestor which she sees be doing relatively well on.   Plan: Check cholesterol panel and CMP.  H/O unstable angina No anginal or heart failure symptoms. Negative Myoview in 2012  Bilateral lower extremity edema - mild, likely venous stasis With mild skin discoloration and trace edema bilaterally, this is probably more consistent with venous insufficiency. At this time is not really overly symptomatic to her. I recommended support stockings.    Orders Placed This Encounter  Procedures  . Comp Met (CMET)  . Cholesterol, Total  . EKG 12-Lead    Followup: 6 months   Kumari Sculley W. [name], M.D., M.S. Interventional Cardiolgy [facility]

## 2013-05-16 NOTE — Assessment & Plan Note (Addendum)
This is currently the primary issue with inadequate control. She says this is basically her baseline pressures. I am not really sure why she is on a combination of 200 mg of losartan/25 mg HCTZ. My inclination if her blood pressure is not controlled when she sees her PCP next month would be to consider switching her to irbesartan 300/12.5 avoid basically pushing a weak ARB beyond its capability.  Another potential change in the future could be switching from Toprol to carvedilol for more antihypertensive effect.  She has been tried on amlodipine but she stopped taking it secondary to edema.

## 2013-05-18 ENCOUNTER — Telehealth: Payer: Self-pay | Admitting: *Deleted

## 2013-05-18 LAB — LIPID PANEL
CHOL/HDL RATIO: 3.1 ratio
CHOLESTEROL: 165 mg/dL (ref 0–200)
HDL: 54 mg/dL (ref >39)
LDL CALC: 75 mg/dL (ref 0–99)
TRIGLYCERIDES: 181 mg/dL — AB (ref <150)
VLDL: 36 mg/dL (ref 0–40)

## 2013-05-18 NOTE — Telephone Encounter (Signed)
Will call results when available

## 2013-05-18 NOTE — Telephone Encounter (Signed)
Message copied by Tobin ChadMARTIN, SHARON V. on Fri May 18, 2013 11:07 AM ------      Message from: Marykay LexHARDING, DAVID W      Created: Fri May 18, 2013  9:42 AM       Chemistries looked okay. Total cholesterol is okay also. I don't see full cholesterol panel.        Will need complete lipid panel prior to next visit. ------

## 2013-05-18 NOTE — Telephone Encounter (Signed)
Spoke to patient. chemistries and total chol. Result given. Patient states she is looking at results on My Chart. RN informed patient that an error had occurred on lab for total cholesterol. She was suppose to have labs for lipid panel.  RN contacted [facility] [phone]). Spoke to rep.  - credit for total chol.was done and order lipid panel given. Lab will be able to use specimen already obtain.   Patient verbalized understanding.

## 2013-05-21 ENCOUNTER — Telehealth: Payer: Self-pay | Admitting: *Deleted

## 2013-05-21 NOTE — Telephone Encounter (Signed)
Spoke to patient. lipid Result given . Verbalized understanding

## 2013-05-21 NOTE — Telephone Encounter (Signed)
Message copied by Tobin ChadMARTIN, SHARON V. on Mon May 21, 2013 10:53 AM ------      Message from: Marykay LexHARDING, DAVID W      Created: Sat May 19, 2013  9:57 PM       Cholesterol levels look pretty good --> only Triglycerides are up a bit.  This correlates mostly with high starchy food diet & simple sugars.            Watch the diet & continue current meds.            Marykay LexHARDING,DAVID W, MD       ------

## 2013-06-28 NOTE — Telephone Encounter (Signed)
Closed encounter °

## 2013-07-11 ENCOUNTER — Telehealth: Payer: Self-pay | Admitting: Cardiology

## 2013-07-11 MED ORDER — CLOPIDOGREL BISULFATE 75 MG PO TABS: 75.0000 mg | ORAL_TABLET | Freq: Every day | ORAL | Status: DC

## 2013-07-11 MED ORDER — METOPROLOL SUCCINATE ER 50 MG PO TB24: 50.0000 mg | ORAL_TABLET | Freq: Every day | ORAL | Status: DC

## 2013-07-11 NOTE — Telephone Encounter (Signed)
Still have not received her medicine. Please call in her Metoprolol 50mg  #90 and Clopidogrel 75mg  #90 and refills. Please call To [facility].

## 2013-07-11 NOTE — Telephone Encounter (Signed)
Returned call and pt verified x 2.  Pt informed message received and refill requests not.  Pt stated she received an e-mail stating the pharmacy was contacting the office w/o response.  Pt stated she doesn't know why b/c she has two bottles left.  Pt informed RN will send in refills and asked that she inform pharmacy as well that they must send a hard copy fax for any meds Dr. Alanda AmassWeintraub rx'd.  Pt verbalized understanding and agreed w/ plan.   Refill(s) sent to pharmacy.

## 2013-08-14 ENCOUNTER — Telehealth: Payer: Self-pay | Admitting: Cardiology

## 2013-08-14 MED ORDER — POTASSIUM CHLORIDE CRYS ER 20 MEQ PO TBCR: 20.0000 meq | EXTENDED_RELEASE_TABLET | Freq: Every day | ORAL | Status: DC

## 2013-08-14 MED ORDER — CLOPIDOGREL BISULFATE 75 MG PO TABS: 75.0000 mg | ORAL_TABLET | Freq: Every day | ORAL | Status: DC

## 2013-08-14 NOTE — Telephone Encounter (Signed)
Patient needs refills on the following medications--Clopidogrel 75 mg # 90 1 daily w 3 refills------Klor-con 20 meq # 90 1 daily with 3 refills.  Please send to [facility] under Dr. Elissa Hefty name.  She is seeing Dr. Herbie Baltimore now that Dr. Alanda Amass has retired.

## 2013-08-14 NOTE — Telephone Encounter (Signed)
Rx was sent to pharmacy electronically. 

## 2013-09-13 ENCOUNTER — Telehealth: Payer: Self-pay | Admitting: Cardiology

## 2013-09-13 MED ORDER — ROSUVASTATIN CALCIUM 5 MG PO TABS: 5.0000 mg | ORAL_TABLET | Freq: Every day | ORAL | Status: DC

## 2013-09-13 NOTE — Telephone Encounter (Signed)
Returned call to patient 90 day refill for crestor sent to pharmacy.

## 2013-09-13 NOTE — Telephone Encounter (Signed)
Need a new prescription for her Crestor 5 mg #90 and refills. Please call to [phone]

## 2013-11-13 ENCOUNTER — Encounter: Payer: Self-pay | Admitting: Cardiology

## 2013-11-13 ENCOUNTER — Ambulatory Visit (INDEPENDENT_AMBULATORY_CARE_PROVIDER_SITE_OTHER): Payer: Medicare Other | Admitting: Cardiology

## 2013-11-13 VITALS — BP 148/80 | HR 76 | Ht 63.5 in | Wt 166.3 lb

## 2013-11-13 DIAGNOSIS — R609 Edema, unspecified: Secondary | ICD-10-CM

## 2013-11-13 DIAGNOSIS — I25812 Atherosclerosis of bypass graft of coronary artery of transplanted heart without angina pectoris: Secondary | ICD-10-CM

## 2013-11-13 DIAGNOSIS — I25810 Atherosclerosis of coronary artery bypass graft(s) without angina pectoris: Secondary | ICD-10-CM

## 2013-11-13 DIAGNOSIS — R42 Dizziness and giddiness: Secondary | ICD-10-CM

## 2013-11-13 DIAGNOSIS — R0989 Other specified symptoms and signs involving the circulatory and respiratory systems: Secondary | ICD-10-CM

## 2013-11-13 DIAGNOSIS — I10 Essential (primary) hypertension: Secondary | ICD-10-CM

## 2013-11-13 DIAGNOSIS — R0609 Other forms of dyspnea: Secondary | ICD-10-CM

## 2013-11-13 DIAGNOSIS — I2510 Atherosclerotic heart disease of native coronary artery without angina pectoris: Secondary | ICD-10-CM

## 2013-11-13 DIAGNOSIS — R600 Localized edema: Secondary | ICD-10-CM

## 2013-11-13 DIAGNOSIS — E785 Hyperlipidemia, unspecified: Secondary | ICD-10-CM

## 2013-11-13 NOTE — Assessment & Plan Note (Addendum)
Better control. With her postural dizziness, would not increase her anti-hypertensive regimen.

## 2013-11-13 NOTE — Assessment & Plan Note (Signed)
Very close to goal based on last labs. Can recheck in March of 2016. Will check NMR panel.

## 2013-11-13 NOTE — Assessment & Plan Note (Signed)
No active anginal symptoms, but she is starting to notice some exertional dyspnea. We'll reassess in 3 months and consider the possibility of repeating a Myoview to evaluate for graft patency.

## 2013-11-13 NOTE — Progress Notes (Signed)
PCP: Lupe Carney, MD  Clinic Note: Chief Complaint  Patient presents with  . 6 month visit    no chest pain, occasional sob, edema    HPI: [name] is a 68 y.o. female with a Cardiovascular Problem List below who presents today for 6 month f/u of CAD-CABG.  Interval History: [name] presents today doing relatively well. She continues to be very active working in her garden almost 4-5 hours a day. The subtle symptoms that she describes as increasing exertional dyspnea for instance going up stairs exertion in the garden is very tired than she used to. She is working very aren't in her garden and as noted feeling some positional dizziness when sitting to standing especially when in the garden and bending over then standing back up again.  She has not actually felt as though she would pass out, has felt relatively and dizzy to the point where she needed to sit down. She has been trying to stay adequately hydrated, but has noted her symptoms are worse when the extreme heat and humidity.  From a cardiac standpoint, she really denies resting or exertional chest pressure or burning sensation that was her previous anginal equivalent. She denies any PND, orthopnea, does have chronic edema worse in the left than the right leg. This usually goes down by the end of the day when she elevates her legs. She has noted that her varicose veins and a little bit larger and will bulge out during the day. She has occasional palpitations but no rapid heart rates or rhythm.  She does get dizziness as noted above, but has not had any sensations of syncope or near syncope. No TIA or amaurosis fugax symptoms. No claudication symptoms. No real sensations from venous stasis such as aching or fullness or discomfort in her legs.  Past Medical History  Diagnosis Date  . H/O unstable angina  2003    Cardiac cath with multivessel disease referred for CABG  . Left main coronary artery disease 2003    70% ostial left main,  100% Cx,; patent, normal RCA  . 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 CAD (coronary artery disease) of bypass graft 2004/2008    04 cath revealed occluded SVG-D1; other grafts patent  . Hypertension, benign   . Hyperlipidemia LDL goal <70    Prior Cardiac Evaluation and Past Surgical History: Past Surgical History  Procedure Laterality Date  . Cardiac catheterization   Novembeer 2003    70% left main, 100 % Cx @ OM; normal RCA  . Coronary artery bypass graft  November 2003    Dr. Laneta Simmers: LIMA-LAD, SVG-OM 1, SVG-D1  . Cardiac catheterization  2004, 2008    Occluded SVG-D1 with antegrade native D1 flow; LM increased from 70-90% with patent LIMA and SVG-OM1; patent RCA ; EF of 45-50% with 2+ MR   . Nm myoview ltd  2012    No evidence of ischemia, normal EF  . Transthoracic echocardiogram  2013    Normal LV function with normal valves.  . Carotid duplex ultrasound  July 2014    Less than 49% right carotid; relatively normal left carotid.    MEDICATIONS AND ALLERGIES REVIEWED IN EPIC  Her low starting Hytrin ACTZ was converted to Avalide (300/12.5 mg) this March No Change in Social and Family History  ROS: A comprehensive Review of Systems - was performed Review of Systems  Constitutional: Negative for fever, chills, weight loss and diaphoresis.  HENT: Negative for nosebleeds.   Respiratory: Positive for shortness of breath. Negative for cough, hemoptysis, sputum production and wheezing.        Exertional. Noted in history of present illness  Cardiovascular: Positive for palpitations and leg swelling.       Per history of present illness  Gastrointestinal: Positive for heartburn. Negative for constipation and blood in stool.  Genitourinary: Negative for frequency, hematuria and flank pain.  Musculoskeletal: Negative.   Neurological: Negative for dizziness, sensory change, speech change, focal weakness, seizures, loss of consciousness and headaches.    Endo/Heme/Allergies: Does not bruise/bleed easily.  All other systems reviewed and are negative.  Wt Readings from Last 3 Encounters:  11/13/13 166 lb 4.8 oz (75.433 kg)  05/14/13 164 lb 4.8 oz (74.526 kg)    PHYSICAL EXAM BP 148/80  Pulse 76  Ht 5' 3.5" (1.613 m)  Wt 166 lb 4.8 oz (75.433 kg)  BMI 28.99 kg/m2 General appearance: alert, cooperative, appears stated age, no distress and Normal mood and affect. Answers questions appropriately. Healthy-appearing  Neck: no adenopathy, no carotid bruit, no JVD, supple, symmetrical, trachea midline and thyroid not enlarged, symmetric, no tenderness/mass/nodules  Lungs: CTAB, normal percussion bilaterally and Nonlabored, good air movement  Heart: RRR, normal S1-S2. Soft S4. 1/6 HSM at left lower sternal border/apex. No other R./G. No HJR  Abdomen: soft, non-tender; bowel sounds normal; no masses, no organomegaly  Extremities: extremities normal, atraumatic, no cyanosis; trace right and 1+ left lower extremity edema with mild varicose veins noted and minimal venous stasis dermatitis noted  Pulses: 2+ and symmetric  Neurologic: Alert and oriented X 3, normal strength and tone. Normal symmetric reflexes. Normal coordination and gait   Adult ECG Report  Rate: 76 ;  Rhythm: normal sinus rhythm; nonspecific ST and T wave changes with left atrial enlargement. Otherwise normal EKG.  Recent Labs lipid panel from March:   TC 165, TG 181, HDL 54, LDL 75  ASSESSMENT / PLAN: Left main coronary artery disease - 90% No active anginal symptoms, but she is starting to notice some exertional dyspnea. We'll reassess in 3 months and consider the possibility of repeating a Myoview to evaluate for graft patency.  CAD (coronary artery disease) of bypass graft; occluded SVG-D1 Known occluded SVG-D1. Reassess exertional dyspnea in 3 months. If still present, will check Myoview.  Exertional dyspnea This may simply be related to the hot summer. She did not  have these symptoms before, she may be also noting the increased blood pressure effect of the Avalide versus Hyzaar. Reassess in 3 months. If still present, check Myoview  Postural dizziness Slightly related to exertion and possible mild dehydration in the setting of 2 antihypertensive medications. Plan: Ensure adequate hydration.  Split up antihypertensive agents into a am and p.m. dose  Hypertension, benign Better control. With her postural dizziness, would not increase her anti-hypertensive regimen.  Bilateral lower extremity edema - mild, likely venous stasis Foot elevation. Support hose when tolerated.  Hyperlipidemia LDL goal <70 Very close to goal based on last labs. Can recheck in March of 2016. Will check NMR panel.    Orders Placed This Encounter  Procedures  . EKG 12-Lead   Changed medication: Meds ordered this encounter  Medications  . irbesartan-hydrochlorothiazide (AVALIDE) 300-12.5 MG per tablet    Sig: Take 1 tablet by mouth daily.     Followup: 3 months  DAVID W. Herbie Baltimore, M.D., M.S. Interventional Cardiologist [facility]

## 2013-11-13 NOTE — Assessment & Plan Note (Signed)
This may simply be related to the hot summer. She did not have these symptoms before, she may be also noting the increased blood pressure effect of the Avalide versus Hyzaar. Reassess in 3 months. If still present, check Myoview

## 2013-11-13 NOTE — Assessment & Plan Note (Signed)
Known occluded SVG-D1. Reassess exertional dyspnea in 3 months. If still present, will check Myoview.

## 2013-11-13 NOTE — Patient Instructions (Addendum)
Keep hydrate  Take metoprolol at bedtime  TAKE IRBERSARTAN-HCTZ IN THE MORNING.   Your physician wants you to follow-up in 3 month Dr Herbie Baltimore.  You will receive a reminder letter in the mail two months in advance. If you don't receive a letter, please call our office to schedule the follow-up appointment.

## 2013-11-13 NOTE — Assessment & Plan Note (Signed)
Foot elevation. Support hose when tolerated.

## 2013-11-13 NOTE — Assessment & Plan Note (Signed)
Slightly related to exertion and possible mild dehydration in the setting of 2 antihypertensive medications. Plan: Ensure adequate hydration.  Split up antihypertensive agents into a am and p.m. dose

## 2013-11-20 ENCOUNTER — Other Ambulatory Visit: Payer: Self-pay

## 2013-11-20 MED ORDER — ROSUVASTATIN CALCIUM 5 MG PO TABS: 5.0000 mg | ORAL_TABLET | Freq: Every day | ORAL | Status: DC

## 2013-11-20 NOTE — Telephone Encounter (Signed)
Rx was sent to pharmacy electronically. 

## 2013-11-22 ENCOUNTER — Telehealth: Payer: Self-pay | Admitting: Cardiology

## 2013-11-22 MED ORDER — ROSUVASTATIN CALCIUM 5 MG PO TABS: 5.0000 mg | ORAL_TABLET | Freq: Every day | ORAL | Status: DC

## 2013-11-22 NOTE — Telephone Encounter (Signed)
States she got an e-mail last PM stating they have not received a refill on her Crestor, although our records indicate it was done 11/20/13.  Will resend to [employer]. Patient voiced understanding.

## 2013-11-22 NOTE — Telephone Encounter (Signed)
Pt is having her Crestor switched from [facility] to [facility].Please call or send this to [facility] please.

## 2014-01-09 ENCOUNTER — Other Ambulatory Visit: Payer: Self-pay | Admitting: Family Medicine

## 2014-02-12 ENCOUNTER — Encounter: Payer: Self-pay | Admitting: Cardiovascular Disease

## 2014-02-21 ENCOUNTER — Ambulatory Visit (HOSPITAL_BASED_OUTPATIENT_CLINIC_OR_DEPARTMENT_OTHER): Payer: Medicare Other | Attending: Family Medicine | Admitting: Radiology

## 2014-02-21 VITALS — Ht 63.0 in | Wt 161.0 lb

## 2014-02-21 DIAGNOSIS — Z6828 Body mass index (BMI) 28.0-28.9, adult: Secondary | ICD-10-CM | POA: Diagnosis not present

## 2014-02-21 DIAGNOSIS — G4710 Hypersomnia, unspecified: Secondary | ICD-10-CM | POA: Insufficient documentation

## 2014-02-21 DIAGNOSIS — G4733 Obstructive sleep apnea (adult) (pediatric): Secondary | ICD-10-CM

## 2014-02-21 DIAGNOSIS — G4730 Sleep apnea, unspecified: Secondary | ICD-10-CM | POA: Diagnosis not present

## 2014-02-24 DIAGNOSIS — G4733 Obstructive sleep apnea (adult) (pediatric): Secondary | ICD-10-CM

## 2014-02-24 NOTE — Sleep Study (Signed)
   NAME: [name] DATE OF BIRTH:  1945/12/03 MEDICAL RECORD NUMBER [id number]  LOCATION: [facility]  PHYSICIAN: Jair Lindblad D  DATE OF STUDY: 02/21/2014  SLEEP STUDY TYPE: Nocturnal Polysomnogram               REFERRING PHYSICIAN: Lupe CarneyMitchell, Dean, MD  INDICATION FOR STUDY: Hypersomnia with sleep apnea  EPWORTH SLEEPINESS SCORE:   11/24 HEIGHT: 5\' 3"  (160 cm)  WEIGHT: 161 lb (73.029 kg)    Body mass index is 28.53 kg/(m^2).  NECK SIZE: 13.5 in.  MEDICATIONS: Charted for review  SLEEP ARCHITECTURE: Total sleep time 342 minutes with sleep efficiency 78.4%. Stage I was 3.7%, stage II 75%, stage III absent, REM 21.3% of total sleep time. Sleep latency 13 minutes, REM latency 143.5 minutes, awake after sleep onset 81 minutes, arousal index 8.2, bedtime medication: None  RESPIRATORY DATA: Apnea hypopnea index (AHI) 9.3 per hour. 53 total events scored including 2 obstructive apneas and 51 hypopneas. All events were while supine. REM AHI 23 per hour. There were not enough early events to permit application of split protocol CPAP titration.  OXYGEN DATA: Moderate snoring with oxygen desaturation to a nadir of 75% and mean saturation 93.1% on room air.  CARDIAC DATA: Sinus rhythm with PVCs  MOVEMENT/PARASOMNIA: Occasional limb jerks without apparent effect on sleep. Bathroom 1  IMPRESSION/ RECOMMENDATION:   1) Mild obstructive sleep apnea/hypopnea syndrome, AHI 9.3 per hour with supine events. REM AHI 23 per hour. Moderate snoring with oxygen desaturation to a nadir of 75% and mean saturation 93.1% on room air. 2) There were not enough early events to permit application of split protocol CPAP titration on this study. If appropriate, this patient can return through the sleep disorder center for a dedicated CPAP titration study.   Waymon BudgeYOUNG,[place]muth Recupero D Diplomate, American Board of Sleep Medicine  ELECTRONICALLY SIGNED ON:  02/24/2014, 10:29 AM [facility] PH: [phone]   FX: [phone] ACCREDITED BY THE AMERICAN ACADEMY OF SLEEP MEDICINE

## 2014-02-26 ENCOUNTER — Encounter: Payer: Self-pay | Admitting: Cardiology

## 2014-02-26 ENCOUNTER — Ambulatory Visit (INDEPENDENT_AMBULATORY_CARE_PROVIDER_SITE_OTHER): Payer: Medicare Other | Admitting: Cardiology

## 2014-02-26 VITALS — BP 181/92 | HR 81 | Ht 64.0 in | Wt 164.0 lb

## 2014-02-26 DIAGNOSIS — I10 Essential (primary) hypertension: Secondary | ICD-10-CM

## 2014-02-26 DIAGNOSIS — I2510 Atherosclerotic heart disease of native coronary artery without angina pectoris: Secondary | ICD-10-CM

## 2014-02-26 DIAGNOSIS — Z8679 Personal history of other diseases of the circulatory system: Secondary | ICD-10-CM

## 2014-02-26 DIAGNOSIS — Z79899 Other long term (current) drug therapy: Secondary | ICD-10-CM

## 2014-02-26 DIAGNOSIS — R42 Dizziness and giddiness: Secondary | ICD-10-CM

## 2014-02-26 DIAGNOSIS — I25812 Atherosclerosis of bypass graft of coronary artery of transplanted heart without angina pectoris: Secondary | ICD-10-CM

## 2014-02-26 DIAGNOSIS — E785 Hyperlipidemia, unspecified: Secondary | ICD-10-CM

## 2014-02-26 DIAGNOSIS — R600 Localized edema: Secondary | ICD-10-CM

## 2014-02-26 DIAGNOSIS — Z951 Presence of aortocoronary bypass graft: Secondary | ICD-10-CM

## 2014-02-26 DIAGNOSIS — R0609 Other forms of dyspnea: Secondary | ICD-10-CM

## 2014-02-26 LAB — COMPREHENSIVE METABOLIC PANEL
ALBUMIN: 4.2 g/dL (ref 3.5–5.2)
ALT: 17 U/L (ref 0–35)
AST: 17 U/L (ref 0–37)
Alkaline Phosphatase: 60 U/L (ref 39–117)
BUN: 17 mg/dL (ref 6–23)
CO2: 30 mEq/L (ref 19–32)
CREATININE: 0.77 mg/dL (ref 0.50–1.10)
Calcium: 9.8 mg/dL (ref 8.4–10.5)
Chloride: 104 mEq/L (ref 96–112)
GLUCOSE: 94 mg/dL (ref 70–99)
Potassium: 4.3 mEq/L (ref 3.5–5.3)
Sodium: 140 mEq/L (ref 135–145)
Total Bilirubin: 0.5 mg/dL (ref 0.2–1.2)
Total Protein: 6.5 g/dL (ref 6.0–8.3)

## 2014-02-26 LAB — LIPID PANEL
CHOLESTEROL: 149 mg/dL (ref 0–200)
HDL: 53 mg/dL (ref >39)
LDL Cholesterol: 72 mg/dL (ref 0–99)
Total CHOL/HDL Ratio: 2.8 Ratio
Triglycerides: 118 mg/dL (ref <150)
VLDL: 24 mg/dL (ref 0–40)

## 2014-02-26 NOTE — Patient Instructions (Signed)
Your physician recommends that you return for lab work -CMP,LIPIDS- CAN DO TODAY.   You may stop your cholesterol medication-Crestor for one month to see if cramps go away.  if cramps are still present , restart medication. If medication go away contact office.   Dr Herbie BaltimoreHarding would like you to have blood pressure checked in 2- 3 weeks , either at primary's office or [facility] office   Your physician wants you to follow-up in 6 month Dr Herbie BaltimoreHARDING.  You will receive a reminder letter in the mail two months in advance. If you don't receive a letter, please call our office to schedule the follow-up appointme

## 2014-02-26 NOTE — Progress Notes (Signed)
Review of Systems  Musculoskeletal: Negative.      PCP: Lupe CarneyMITCHELL,DEAN, MD  Clinic Note: Chief Complaint  Patient presents with  . Follow-up    pt denies chest pain, and sob. pt states that she does experience swelling in her feet but that is not unusual for her.    HPI: [name] is a 68 y.o. female with a Cardiovascular Problem List below who presents today for 3 month  f/u of CAD-CABG.  Interval History: She presents doing quite well without any major complaints. 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 She is also sent for a sleep study and thinks that she may have to go for CPAP titration. Her exertional dyspnea is less pronounced -- noticing it only with increase levels of exertion or walking uphill.   She continues to be very active working in her garden almost 4-5 hours a day. She does have intermittent episodes of discomfort and dyspnea. For the most part however they're basically really subtle and as long as she is doing her routine activities she doesn't really notice much. Her exertional dyspnea has definitely improved but she still gets short of breath she goes up and down hills or steps. But improved. She has noted feeling some positional dizziness when sitting to standing especially when in the garden and bending over then standing back up again. No syncope or near-syncopal symptoms --and these episodes are less frequent now it is cooler.  From a cardiac standpoint, she really denies resting or exertional chest pressure or burning sensation that was her previous anginal equivalent. She denies any PND, orthopnea, does have chronic edema worse in the left than the right leg. This usually goes down by the end of the day when she elevates her legs. She has noted that her varicose veins have been better controlled. She has occasional palpitations but no rapid heart rates or rhythm.  She does get  dizziness as noted above, but has not had any sensations of syncope or near syncope. No TIA or amaurosis fugax symptoms. No claudication symptoms. No real sensations from venous stasis such as aching or fullness or discomfort in her legs.  Past Medical History  Diagnosis Date  . H/O unstable angina  2003    Cardiac cath with multivessel disease referred for CABG  . Left main coronary artery disease 2003    70% ostial left main, 100% Cx,; patent, normal RCA  . 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 Laneta SimmersBartle)   . CAD (coronary artery disease) of bypass graft 2004/2008    Cath 2008: SVG-D1 Occluded with patent D1, LM 90%, Patent LIMA-LAD & SVG-OM1, patent Native RCA, EF ~45-50% & 2+ MR.; Myoview 2012: No ischemia or infarction, Normal EF; Echo 2013: Nl LV Fxn & normal valves   . Hypertension, benign   . Hyperlipidemia LDL goal <70    Current Outpatient Prescriptions on File Prior to Visit  Medication Sig Dispense Refill  . aspirin 81 MG tablet Take 81 mg by mouth daily.    . Cholecalciferol (VITAMIN D-3) 1000 UNITS CAPS Take 2 capsules by mouth daily.    . clopidogrel (PLAVIX) 75 MG tablet Take 1 tablet (75 mg total) by mouth daily. 90 tablet 2  . fexofenadine (ALLEGRA) 180 MG tablet Take 180 mg by mouth daily as needed for allergies or rhinitis.    . folic  acid (FOLVITE) 400 MCG tablet Take 400 mcg by mouth daily.    . irbesartan-hydrochlorothiazide (AVALIDE) 300-12.5 MG per tablet Take 1 tablet by mouth daily.     . Magnesium 250 MG TABS Take 1 tablet by mouth daily.    . metoprolol succinate (TOPROL-XL) 50 MG 24 hr tablet Take 1 tablet (50 mg total) by mouth daily. Take with or immediately following a meal. 90 tablet 2  . Multiple Vitamin (MULTIVITAMIN) capsule Take 1 capsule by mouth daily.    . Omega-3 Fatty Acids (FISH OIL) 1200 MG CAPS Take 2 capsules by mouth daily.    . potassium chloride SA (KLOR-CON M20) 20 MEQ tablet Take 1 tablet (20 mEq total) by mouth daily. 90  tablet 2  . rosuvastatin (CRESTOR) 5 MG tablet Take 1 tablet (5 mg total) by mouth daily. 90 tablet 3   No current facility-administered medications on file prior to visit.   Allergies  Allergen Reactions  . Etodolac Anaphylaxis    migraines  . Motrin [Ibuprofen] Swelling    Throat swells  . Statins     Myalgias    No Change in Social and Family History - under quite a bit of stress with her husband having health issues and her daughter-in-law just having a miscarriage.  ROS: A comprehensive Review of Systems - was performed Review of Systems  Constitutional: Negative for fever, chills, weight loss and diaphoresis.  HENT: Negative for nosebleeds.   Respiratory: Positive for exertional shortness of breath. Negative for cough, hemoptysis, sputum production and wheezing.       Cardiovascular: Positive for palpitations and leg swelling.  Gastrointestinal: Positive for heartburn. Negative for constipation and blood in stool.  Genitourinary: Negative for frequency, hematuria and flank pain.  Musculoskeletal: Leg cramping, mostly at night. Neurological: Negative for dizziness, sensory change, speech change, focal weakness, seizures, loss of consciousness and headaches.  Endo/Heme/Allergies: Does not bruise/bleed easily.  All other systems reviewed and are negative.  Wt Readings from Last 3 Encounters:  02/26/14 164 lb (74.39 kg)  02/21/14 161 lb (73.029 kg)  11/13/13 166 lb 4.8 oz (75.433 kg)   PHYSICAL EXAM BP 181/92 mmHg  Pulse 81  Ht 5\' 4"  (1.626 m)  Wt 164 lb (74.39 kg)  BMI 28.14 kg/m2 General appearance: alert, cooperative, appears stated age, no distress and Normal mood and affect. Answers questions appropriately. Healthy-appearing  Neck: no adenopathy, no carotid bruit, no JVD, supple, symmetrical, trachea midline and thyroid not enlarged, symmetric, no tenderness/mass/nodules  Lungs: CTAB, normal percussion bilaterally and Nonlabored, good air movement  Heart: RRR,  normal S1-S2. Soft S4. 1/6 HSM at left lower sternal border/apex. No other R./G. No HJR  Abdomen: soft, non-tender; bowel sounds normal; no masses, no organomegaly  Extremities: extremities normal, atraumatic, no cyanosis; trace right and 1+ left lower extremity edema with mild varicose veins noted and minimal venous stasis dermatitis noted  Pulses: 2+ and symmetric  Neurologic: Alert and oriented X 3, normal strength and tone. Normal symmetric reflexes. Normal coordination and gait   Adult ECG Report not checked   No recent labs - fasted today.  ASSESSMENT / PLAN: CAD (coronary artery disease) of bypass graft; occluded SVG-D1 Improved exertional dyspnea. No active anginal symptoms. Continue dual antiplatelet therapy. She is on ARB, beta blocker as well as statin.  Hyperlipidemia LDL goal <70 Close to goal by recent labs. Will recheck labs today. Depending on how they look, could consider a lab holiday with Crestor. Stop for a month to see  if her cramps improved.  Hypertension, benign Well-controlled today. She is under a lot of stress having just been told her daughter had a miscarriage and her husband is recently diagnosed with elevated PSA. Would like to recheck her blood pressure in about 2-3 weeks to see where he stands. If still elevated may need to consider increasing her beta blocker for converting to carvedilol  H/O unstable angina No active anginal symptoms now. Had CABG in 2003 with an occluded vein graft in 2008. Otherwise negative Myoview in relatively normal echo EF. No bleeding on dual antiplatelet therapy  Bilateral lower extremity edema - mild, likely venous stasis Continue foot elevation and support hose  Postural dizziness Less noticeable now that it is winter.    Orders Placed This Encounter  Procedures  . Lipid panel    Order Specific Question:  Has the patient fasted?    Answer:  Yes  . Comprehensive metabolic panel    Order Specific Question:  Has the  patient fasted?    Answer:  Yes   Changed medication: No orders of the defined types were placed in this encounter.    Followup: Blood pressure check in 2-3 weeks with nurse. Six-month with Dr. Herbie Baltimore  DAVID W. Herbie Baltimore, M.D., M.S. Interventional Cardiologist [facility]

## 2014-02-27 ENCOUNTER — Telehealth: Payer: Self-pay | Admitting: *Deleted

## 2014-02-27 ENCOUNTER — Encounter: Payer: Self-pay | Admitting: Cardiology

## 2014-02-27 NOTE — Assessment & Plan Note (Signed)
No active anginal symptoms now. Had CABG in 2003 with an occluded vein graft in 2008. Otherwise negative Myoview in relatively normal echo EF. No bleeding on dual antiplatelet therapy

## 2014-02-27 NOTE — Telephone Encounter (Signed)
Left message on answer machine- release to my chart Any question may call back

## 2014-02-27 NOTE — Assessment & Plan Note (Signed)
Continue foot elevation and support hose

## 2014-02-27 NOTE — Assessment & Plan Note (Signed)
Improved exertional dyspnea. No active anginal symptoms. Continue dual antiplatelet therapy. She is on ARB, beta blocker as well as statin.

## 2014-02-27 NOTE — Assessment & Plan Note (Signed)
Less noticeable now that it is winter.

## 2014-02-27 NOTE — Assessment & Plan Note (Signed)
Close to goal by recent labs. Will recheck labs today. Depending on how they look, could consider a lab holiday with Crestor. Stop for a month to see if her cramps improved.

## 2014-02-27 NOTE — Telephone Encounter (Signed)
-----   Message from Marykay Lexavid W Harding, MD sent at 02/27/2014  3:01 PM EST ----- Cholesterol levels look good. Total cholesterol is 149 triglycerides 118 HDL 53 and LDL 72. This is even better than last year. Chemistries look good. Glucose is better than last year, potassium is stable and liver function tests are normal  [facility]

## 2014-02-27 NOTE — Assessment & Plan Note (Signed)
Well-controlled today. She is under a lot of stress having just been told her daughter had a miscarriage and her husband is recently diagnosed with elevated PSA. Would like to recheck her blood pressure in about 2-3 weeks to see where he stands. If still elevated may need to consider increasing her beta blocker for converting to carvedilol

## 2014-03-15 ENCOUNTER — Other Ambulatory Visit: Payer: Self-pay | Admitting: Cardiology

## 2014-03-15 NOTE — Telephone Encounter (Signed)
Rx refill sent to patient pharmacy   

## 2014-03-24 ENCOUNTER — Encounter (HOSPITAL_BASED_OUTPATIENT_CLINIC_OR_DEPARTMENT_OTHER): Payer: Medicare Other

## 2014-03-25 ENCOUNTER — Ambulatory Visit (HOSPITAL_BASED_OUTPATIENT_CLINIC_OR_DEPARTMENT_OTHER): Payer: Medicare Other | Attending: Family Medicine | Admitting: Radiology

## 2014-03-25 DIAGNOSIS — G4733 Obstructive sleep apnea (adult) (pediatric): Secondary | ICD-10-CM | POA: Diagnosis not present

## 2014-03-25 DIAGNOSIS — Z9989 Dependence on other enabling machines and devices: Secondary | ICD-10-CM

## 2014-03-30 DIAGNOSIS — G4733 Obstructive sleep apnea (adult) (pediatric): Secondary | ICD-10-CM

## 2014-03-30 NOTE — Sleep Study (Signed)
   NAME: [name] DATE OF BIRTH:  04-Feb-1946 MEDICAL RECORD NUMBER [id number]  LOCATION: [facility]  PHYSICIAN: YOUNG,CLINTON D  DATE OF STUDY: 03/25/2014  SLEEP STUDY TYPE: Nocturnal Polysomnogram               REFERRING PHYSICIAN: Lupe CarneyMitchell, Dean, MD  INDICATION FOR STUDY: Hypersomnia with sleep apnea-CPAP titration  EPWORTH SLEEPINESS SCORE:   11/24 HEIGHT:   5 feet 3 inches  WEIGHT:   161 pounds  BMI 28.5 NECK SIZE: 13.5 in.  MEDICATIONS: Charted for review  SLEEP ARCHITECTURE: Total sleep time 325 minutes with sleep efficiency 71.9%. Stage I was 6.3%, stage II 64.3%, stage III 6.8%, REM 22.6% of total sleep time. Sleep latency 32.5 minutes, REM latency 73 minutes, awake after sleep onset 96.5 minutes, arousal index 8.7, bedtime medication: None  RESPIRATORY DATA: CPAP titration protocol. CPAP was titrated to 7 CWP, AHI 0 per hour. She wore a small fullface mask with heated humidifier.  OXYGEN DATA: Snoring was mostly controlled with some breakthrough noted on final pressure setting. Mean oxygen saturation 94.6%.  CARDIAC DATA: Normal sinus rhythm  MOVEMENT/PARASOMNIA: No significant movement disturbance, bathroom 1  IMPRESSION/ RECOMMENDATION:   1) Successful CPAP titration to 7 CWP, AHI 0 per hour. She wore a small ResMed F10 fullface mask with heated humidifier. Snoring was prevented except for minor breakthrough, and mean oxygen saturation was held 94.6% on room air. 2) Baseline polysomnogram on 02/21/2014 recorded AHI 9.3 per hour with body weight 161 pounds.   Waymon BudgeYOUNG,CLINTON D Diplomate, American Board of Sleep Medicine  ELECTRONICALLY SIGNED ON:  03/30/2014, 4:48 PM [facility] PH: [phone]   FX: [phone] [id number] ACCREDITED BY THE AMERICAN ACADEMY OF SLEEP MEDICINE

## 2014-04-29 ENCOUNTER — Telehealth: Payer: Self-pay | Admitting: Cardiology

## 2014-04-29 NOTE — Telephone Encounter (Signed)
Pt wants to know if Dr Tresa EndoKelly wants her to go back on her Crestor,she no longer have leg cramps.

## 2014-04-29 NOTE — Telephone Encounter (Signed)
Restart Crestor @ 1/2 dose  [facility]

## 2014-04-29 NOTE — Telephone Encounter (Signed)
Dr. Elissa HeftyHarding's instructions relayed to patient. She voiced understanding.

## 2014-04-29 NOTE — Telephone Encounter (Signed)
Pt was on Crestor, had been having leg cramps. Was instructed to stay off and if leg cramps still persisted after a month, to resume crestor.  If cramps resolved, to contact our office.  Since cramps have resolved, she would like to know indication on what to do next.

## 2014-06-06 ENCOUNTER — Other Ambulatory Visit: Payer: Self-pay | Admitting: Cardiology

## 2014-06-17 ENCOUNTER — Other Ambulatory Visit: Payer: Self-pay

## 2014-06-17 ENCOUNTER — Other Ambulatory Visit: Payer: Self-pay | Admitting: Cardiology

## 2014-06-17 DIAGNOSIS — Z1231 Encounter for screening mammogram for malignant neoplasm of breast: Secondary | ICD-10-CM

## 2014-06-27 ENCOUNTER — Ambulatory Visit
Admission: RE | Admit: 2014-06-27 | Discharge: 2014-06-27 | Disposition: A | Discharge status: Home / Self Care | Payer: Medicare Other | Source: Ambulatory Visit

## 2014-06-27 DIAGNOSIS — Z1231 Encounter for screening mammogram for malignant neoplasm of breast: Secondary | ICD-10-CM

## 2014-10-04 ENCOUNTER — Ambulatory Visit (INDEPENDENT_AMBULATORY_CARE_PROVIDER_SITE_OTHER): Payer: Medicare Other | Admitting: Cardiology

## 2014-10-04 ENCOUNTER — Encounter: Payer: Self-pay | Admitting: Cardiology

## 2014-10-04 VITALS — BP 160/76 | HR 79 | Ht 63.0 in | Wt 165.4 lb

## 2014-10-04 DIAGNOSIS — R0609 Other forms of dyspnea: Secondary | ICD-10-CM

## 2014-10-04 DIAGNOSIS — E785 Hyperlipidemia, unspecified: Secondary | ICD-10-CM

## 2014-10-04 DIAGNOSIS — I2510 Atherosclerotic heart disease of native coronary artery without angina pectoris: Secondary | ICD-10-CM | POA: Diagnosis not present

## 2014-10-04 DIAGNOSIS — I10 Essential (primary) hypertension: Secondary | ICD-10-CM | POA: Diagnosis not present

## 2014-10-04 DIAGNOSIS — Z8679 Personal history of other diseases of the circulatory system: Secondary | ICD-10-CM | POA: Diagnosis not present

## 2014-10-04 DIAGNOSIS — Z951 Presence of aortocoronary bypass graft: Secondary | ICD-10-CM

## 2014-10-04 DIAGNOSIS — I25812 Atherosclerosis of bypass graft of coronary artery of transplanted heart without angina pectoris: Secondary | ICD-10-CM | POA: Diagnosis not present

## 2014-10-04 DIAGNOSIS — Z79899 Other long term (current) drug therapy: Secondary | ICD-10-CM

## 2014-10-04 MED ORDER — METOPROLOL SUCCINATE ER 100 MG PO TB24: 100.0000 mg | ORAL_TABLET | Freq: Every day | ORAL | Status: DC

## 2014-10-04 NOTE — Patient Instructions (Signed)
Stop Metroprol Succ    Start Metroprol Succ 1 tab daily.  Schedule in October 2016.   Your physician has requested that you have en exercise stress myoview. For further information please visit [email or web address] Please follow instruction sheet, as given.  Labs Lipid panel, CMP due in October 2016, will mail lab slip.  Your physician wants you to follow-up in: 6 months with Dr Herbie Baltimore.  You will receive a reminder letter in the mail two months in advance. If you don't receive a letter, please call our office to schedule the follow-up appointment.

## 2014-10-04 NOTE — Progress Notes (Signed)
PCP: Lupe Carney, MD  Clinic Note: Chief Complaint  Patient presents with  . Follow-up     6 month f/u , no chest pain, no shortness of breath, has edema,  has occassional pain in legs, has occassional cramping in legs, no lightheadedness, no dizziness  . Coronary Artery Disease    Status post CABG for left main stenosis    HPI: [name] is a 69 y.o. female with a PMH below who presents today for ~6 months f/u of CAD-CABG, HTN.  She has significant left main disease with patent LIMA-LAD and SVG to OM1. Vein graft to diagonal was noted to be occluded  During 2008 cardiac catheterization  Previous anginal symptom was a burning pressure sensation in her chest  Aina was last seen in Dec 2015 = noted improved exertional dyspnea  Recent Hospitalizations: n/a  Studies Reviewed:  Sleep Study -- Now on CPAP x 6 months (Dr. Earl Gala), still trying to get used to the mask.  Interval History: she presents today really feeling well. No major issues. She had some cramping symptoms that, p 60 to reduce Crestor dosing to 3 times a week. Since doing this the cramps have notably improved. Otherwise from cardiac standpoint doing very well without any major complaints. No anginal symptoms or heart failure symptoms.  She does continue to note notably improved exertional dyspnea.  She chronically has some swelling in her legs, left greater than right. It is not a slight pitting edema, to simply thickening of the legs. This predates her CABG. It actually better having used support stockings based on our last discussion. Mild orthostatic dizziness when she goes from bending down and standing up. Rare palpitations  No chest pain or shortness of breath with rest or exertion. No PND, orthopnea. No sudden onset weakness or syncope/near syncope. No TIA/amaurosis fugax symptoms.  Past Medical History  Diagnosis Date  . H/O unstable angina  2003    Cardiac cath with multivessel disease referred for  CABG  . Left main coronary artery disease 2003    70% ostial left main, 100% Cx,; patent, normal RCA  . 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 Laneta Simmers)   . CAD (coronary artery disease) of bypass graft 2004/2008    Cath 2008: SVG-D1 Occluded with patent D1, LM 90%, Patent LIMA-LAD & SVG-OM1, patent Native RCA, EF ~45-50% & 2+ MR.; Myoview 2012: No ischemia or infarction, Normal EF; Echo 2013: Nl LV Fxn & normal valves   . Hypertension, benign   . Hyperlipidemia LDL goal <70     Past Surgical History  Procedure Laterality Date  . Cardiac catheterization   Novembeer 2003    70% left main, 100 % Cx @ OM; normal RCA  . Coronary artery bypass graft  November 2003    Dr. Laneta Simmers: LIMA-LAD, SVG-OM 1, SVG-D1  . Cardiac catheterization  2004, 2008    Occluded SVG-D1 with antegrade native D1 flow; LM increased from 70-90% with patent LIMA and SVG-OM1; patent RCA ; EF of 45-50% with 2+ MR   . Nm myoview ltd  2012    No evidence of ischemia, normal EF  . Transthoracic echocardiogram  2013    Normal LV function with normal valves.  . Carotid duplex ultrasound  July 2014    Less than 49% right carotid; relatively normal left carotid.    ROS: A comprehensive was performed. Review of Systems  Constitutional: Negative for malaise/fatigue.  HENT: Negative for nosebleeds.  Eyes: Negative for blurred vision.  Cardiovascular: Negative for claudication.  Gastrointestinal: Negative for blood in stool.  Genitourinary: Negative for hematuria.  Musculoskeletal: Positive for myalgias (Improved cramping as noted above would reduce Crestor).  Neurological: Positive for dizziness (mmild orthostatic changes). Negative for headaches.  Endo/Heme/Allergies: Does not bruise/bleed easily.  Psychiatric/Behavioral: Negative.   All other systems reviewed and are negative.   Current Outpatient Prescriptions on File Prior to Visit  Medication Sig Dispense Refill  . aspirin 81 MG tablet Take 81 mg  by mouth daily.    . clopidogrel (PLAVIX) 75 MG tablet TAKE 1 TABLET DAILY 90 tablet 1  . fexofenadine (ALLEGRA) 180 MG tablet Take 180 mg by mouth daily as needed for allergies or rhinitis.    . folic acid (FOLVITE) 400 MCG tablet Take 400 mcg by mouth daily.    . irbesartan-hydrochlorothiazide (AVALIDE) 300-12.5 MG per tablet Take 1 tablet by mouth daily.     . Magnesium 250 MG TABS Take 1 tablet by mouth daily.    . Multiple Vitamin (MULTIVITAMIN) capsule Take 1 capsule by mouth daily.    . Omega-3 Fatty Acids (FISH OIL) 1200 MG CAPS Take 2 capsules by mouth daily.    . potassium chloride SA (K-DUR,KLOR-CON) 20 MEQ tablet TAKE 1 TABLET DAILY 90 tablet 1   No current facility-administered medications on file prior to visit.   Allergies  Allergen Reactions  . Etodolac Anaphylaxis    migraines  . Motrin [Ibuprofen] Swelling    Throat swells  . Statins     Myalgias     History   Social History  . Marital Status: Married    Spouse Name: N/A  . Number of Children: N/A  . Years of Education: N/A   Occupational History  . Not on file.   Social History Main Topics  . Smoking status: Never Smoker   . Smokeless tobacco: Never Used  . Alcohol Use: No  . Drug Use: No  . Sexual Activity: Not on file   Other Topics Concern  . Not on file   Social History Narrative   69 year old married white woman, mother of 3, grandmother of 4.   Never smoked. Rare social alcohol.   She is a Hotel manager around the house, and does lots of the chart work.   Her husband is also a 4 patient Dr. Alanda Amass, and now follows up with Dr. Herbie Baltimore.   works in her garden 4-5 hours per day.  History reviewed. No pertinent family history.  Wt Readings from Last 3 Encounters:  10/04/14 75.014 kg (165 lb 6 oz)  02/26/14 74.39 kg (164 lb)  02/21/14 73.029 kg (161 lb)    PHYSICAL EXAM BP 160/76 mmHg  Pulse 79  Ht 5\' 3"  (1.6 m)  Wt 75.014 kg (165 lb 6 oz)  BMI 29.30 kg/m2 General appearance: alert,  cooperative, appears stated age, no distress and Normal mood and affect. Answers questions appropriately. Healthy-appearing  Neck: no adenopathy, no carotid bruit, no JVD, supple, symmetrical, trachea midline and thyroid not enlarged, symmetric, no tenderness/mass/nodules  Lungs: CTAB, normal percussion bilaterally and Nonlabored, good air movement  Heart: RRR, normal S1-S2. Soft S4. 1/6 HSM at left lower sternal border/apex. No other R./G. No HJR  Abdomen: soft, non-tender; bowel sounds normal; no masses, no organomegaly  Extremities: extremities normal, atraumatic, no cyanosis; trace right and 1+ left lower extremity edema with mild varicose veins noted and minimal venous stasis dermatitis noted  Pulses: 2+ and symmetric  Neurologic: Alert and  oriented X 3, normal strength and tone. Normal symmetric reflexes. Normal coordination and gait    Adult ECG Report  Rate: 79 ;  Rhythm: normal sinus rhythm, ~ LAA  Narrative Interpretation: stable EKG  Other studies Reviewed: Additional studies/ records that were reviewed today include:  Recent Labs:  . Lab Results  Component Value Date   CHOL 149 02/26/2014   HDL 53 02/26/2014   LDLCALC 72 02/26/2014   TRIG 118 02/26/2014   CHOLHDL 2.8 02/26/2014    ASSESSMENT / PLAN: Problem List Items Addressed This Visit    CAD (coronary artery disease) of bypass graft; occluded SVG-D1 (Chronic)   Relevant Medications   rosuvastatin (CRESTOR) 5 MG tablet   metoprolol succinate (TOPROL-XL) 100 MG 24 hr tablet   Other Relevant Orders   EKG 12-Lead   Myocardial Perfusion Imaging   Lipid Profile   Comprehensive metabolic panel   Essential hypertension - Primary (Chronic)    Blood pressure still remains high today. Increase her Toprol to 100 mg daily. Taking it at night. If this is not adequately treat her blood pressure, we'll consider switching to carvedilol.      Relevant Medications   rosuvastatin (CRESTOR) 5 MG tablet   metoprolol  succinate (TOPROL-XL) 100 MG 24 hr tablet   Exertional dyspnea    Notably improved. Probably with increasing BP control/afterload reduction.      H/O unstable angina (Chronic)    No active symptoms. Continues to be physically active but no angina. Screening Myoview in October-December time frame      Hyperlipidemia LDL goal <70 (Chronic)    Pretty close to goal by recent labs. She should be due again in the fall and winter timeframe to check labs. She can come in to get her labs done when she gets her stress test performed.      Relevant Medications   rosuvastatin (CRESTOR) 5 MG tablet   metoprolol succinate (TOPROL-XL) 100 MG 24 hr tablet   Other Relevant Orders   EKG 12-Lead   Myocardial Perfusion Imaging   Lipid Profile   Comprehensive metabolic panel   Left main coronary artery disease - 90% (Chronic)    Status post CABG. Last Myoview wasn't 112. She should be due for one by the end of this year for followup of her CABG. She may have  A mild defect in the proximal anterolateral wall related to her occluded vein graft to diagonal brain. I would only react to a high-risk or intermediate risk finding. She is on aspirin plus Plavix with no bleeding issues. On ARB/HCTZ and beta blocker which we are increasing. She is also now on reduced dose Crestor      Relevant Medications   rosuvastatin (CRESTOR) 5 MG tablet   metoprolol succinate (TOPROL-XL) 100 MG 24 hr tablet   S/P CABG x 3;  LIMA-LAD, SVG-OM1, SVG-D1 (occluded) (Chronic)   Relevant Orders   EKG 12-Lead   Myocardial Perfusion Imaging   Lipid Profile   Comprehensive metabolic panel    Other Visit Diagnoses    Polypharmacy        Relevant Orders    EKG 12-Lead    Myocardial Perfusion Imaging    Lipid Profile    Comprehensive metabolic panel       Current medicines are reviewed at length with the patient today. (+/- concerns) Crestor The following changes have been made: her PCP reduced her dosing to 3 times a  week.  PATIENT INSTRUCTIONS: Stop Metroprol Succ 50mg   Start Metroprol Succ 100mg 1 tab daily.  Schedule in October 2016.   Your physician has requested that you have en exercise stress myoview  Labs Lipid panel, CMP due in October 2016, will mail lab slip.  Your physician wants you to follow-up in: 6 months with Dr Herbie Baltimore.  Marykay Lex, M.D., M.S. Interventional Cardiologist   Pager # [phone]

## 2014-10-06 ENCOUNTER — Encounter: Payer: Self-pay | Admitting: Cardiology

## 2014-10-06 NOTE — Assessment & Plan Note (Signed)
Blood pressure still remains high today. Increase her Toprol to 100 mg daily. Taking it at night. If this is not adequately treat her blood pressure, we'll consider switching to carvedilol.

## 2014-10-06 NOTE — Assessment & Plan Note (Signed)
Notably improved. Probably with increasing BP control/afterload reduction.

## 2014-10-06 NOTE — Assessment & Plan Note (Signed)
Pretty close to goal by recent labs. She should be due again in the fall and winter timeframe to check labs. She can come in to get her labs done when she gets her stress test performed.

## 2014-10-06 NOTE — Assessment & Plan Note (Signed)
Status post CABG. Last Myoview wasn't 112. She should be due for one by the end of this year for followup of her CABG. She may have  A mild defect in the proximal anterolateral wall related to her occluded vein graft to diagonal brain. I would only react to a high-risk or intermediate risk finding. She is on aspirin plus Plavix with no bleeding issues. On ARB/HCTZ and beta blocker which we are increasing. She is also now on reduced dose Crestor

## 2014-10-06 NOTE — Assessment & Plan Note (Signed)
No active symptoms. Continues to be physically active but no angina. Screening Myoview in October-December time frame

## 2014-11-13 ENCOUNTER — Other Ambulatory Visit: Payer: Self-pay | Admitting: Cardiology

## 2014-11-13 NOTE — Telephone Encounter (Signed)
REFILL 

## 2014-11-26 ENCOUNTER — Other Ambulatory Visit: Payer: Self-pay | Admitting: Cardiology

## 2014-11-26 NOTE — Telephone Encounter (Signed)
Rx(s) sent to pharmacy electronically.  

## 2014-11-27 ENCOUNTER — Other Ambulatory Visit: Payer: Self-pay | Admitting: Cardiology

## 2014-11-27 MED ORDER — ROSUVASTATIN CALCIUM 5 MG PO TABS: 5.0000 mg | ORAL_TABLET | ORAL | Status: DC

## 2014-11-27 NOTE — Telephone Encounter (Signed)
°  1. Which medications need to be refilled? Generic Crestor and directions should be 3 times a week  2. Which pharmacy is medication to be sent to?[facility]  3. Do they need a 30 day or 90 day supply? 90 and refills  4. Would they like a call back once the medication has been sent to the pharmacy?yes

## 2014-11-27 NOTE — Telephone Encounter (Signed)
Refill submitted to patient's preferred pharmacy. Informed patient. Pt voiced understanding, also inquired about a stress test that was ordered. I see order in system, she has not been contacted yet - will send a message to scheduler. Pt aware.

## 2014-12-04 ENCOUNTER — Telehealth (HOSPITAL_COMMUNITY): Payer: Self-pay

## 2014-12-04 NOTE — Telephone Encounter (Signed)
Encounter complete. 

## 2014-12-06 ENCOUNTER — Ambulatory Visit (HOSPITAL_COMMUNITY)
Admission: RE | Admit: 2014-12-06 | Discharge: 2014-12-06 | Disposition: A | Discharge status: Home / Self Care | Payer: Medicare Other | Source: Ambulatory Visit | Attending: Cardiology | Admitting: Cardiology

## 2014-12-06 ENCOUNTER — Telehealth: Payer: Self-pay | Admitting: *Deleted

## 2014-12-06 DIAGNOSIS — E785 Hyperlipidemia, unspecified: Secondary | ICD-10-CM | POA: Diagnosis not present

## 2014-12-06 DIAGNOSIS — R785 Finding of other psychotropic drug in blood: Secondary | ICD-10-CM | POA: Diagnosis not present

## 2014-12-06 DIAGNOSIS — R002 Palpitations: Secondary | ICD-10-CM | POA: Insufficient documentation

## 2014-12-06 DIAGNOSIS — R600 Localized edema: Secondary | ICD-10-CM | POA: Insufficient documentation

## 2014-12-06 DIAGNOSIS — I25812 Atherosclerosis of bypass graft of coronary artery of transplanted heart without angina pectoris: Secondary | ICD-10-CM | POA: Diagnosis not present

## 2014-12-06 DIAGNOSIS — Z951 Presence of aortocoronary bypass graft: Secondary | ICD-10-CM | POA: Insufficient documentation

## 2014-12-06 DIAGNOSIS — R0609 Other forms of dyspnea: Secondary | ICD-10-CM | POA: Diagnosis not present

## 2014-12-06 DIAGNOSIS — R42 Dizziness and giddiness: Secondary | ICD-10-CM | POA: Insufficient documentation

## 2014-12-06 DIAGNOSIS — Z79899 Other long term (current) drug therapy: Secondary | ICD-10-CM | POA: Insufficient documentation

## 2014-12-06 DIAGNOSIS — Z8249 Family history of ischemic heart disease and other diseases of the circulatory system: Secondary | ICD-10-CM | POA: Insufficient documentation

## 2014-12-06 DIAGNOSIS — I10 Essential (primary) hypertension: Secondary | ICD-10-CM | POA: Diagnosis not present

## 2014-12-06 LAB — MYOCARDIAL PERFUSION IMAGING
CHL CUP MPHR: 151 {beats}/min
CHL CUP RESTING HR STRESS: 83 {beats}/min
CHL RATE OF PERCEIVED EXERTION: 16
CSEPED: 5 min
CSEPEW: 7 METS
Exercise duration (sec): 25 s
LVDIAVOL: 68 mL
LVSYSVOL: 21 mL
Peak HR: 153 {beats}/min
Percent HR: 101 %
SDS: 1
SRS: 0
SSS: 1
TID: 1.22

## 2014-12-06 MED ORDER — TECHNETIUM TC 99M SESTAMIBI GENERIC - CARDIOLITE
31.7000 | Freq: Once | INTRAVENOUS | Status: AC | PRN
  Administered 2014-12-06: 31.7 via INTRAVENOUS

## 2014-12-06 MED ORDER — TECHNETIUM TC 99M SESTAMIBI GENERIC - CARDIOLITE
10.9000 | Freq: Once | INTRAVENOUS | Status: AC | PRN
  Administered 2014-12-06: 10.9 via INTRAVENOUS

## 2014-12-06 NOTE — Progress Notes (Signed)
Quick Note:  Stress Test looked good!! No sign of significant Heart Artery Disease. Pump function is normal. EKG portion would have been abnormal. BP went up with exercise - indicates that we need to work on this. The "defect" seen is consistent with what we call "breast attenuation" an artifact seen commonly in women.  Good news!!.  Marykay Lex, MD  ______

## 2014-12-06 NOTE — Telephone Encounter (Signed)
-----   Message from Marykay Lex, MD sent at 12/06/2014  5:01 PM EDT ----- Notes Recorded by Marykay Lex, MD on 12/06/2014 at 5:00 PM Stress Test looked good!! No sign of significant Heart Artery Disease. Pump function is normal. EKG portion would have been abnormal. BP went up with exercise - indicates that we need to work on this. The "defect" seen is consistent with what we call "breast attenuation" an artifact seen commonly in women.  Good news!!.  Marykay Lex, MD

## 2014-12-06 NOTE — Telephone Encounter (Signed)
Spoke to patient. Result given . Verbalized understanding  

## 2014-12-06 NOTE — Progress Notes (Signed)
Quick Note:  Notes Recorded by Marykay Lex, MD on 12/06/2014 at 5:00 PM Stress Test looked good!! No sign of significant Heart Artery Disease. Pump function is normal. EKG portion would have been abnormal. BP went up with exercise - indicates that we need to work on this. The "defect" seen is consistent with what we call "breast attenuation" an artifact seen commonly in women.  Good news!!.  Marykay Lex, MD  ______

## 2014-12-10 ENCOUNTER — Telehealth: Payer: Self-pay | Admitting: *Deleted

## 2014-12-10 DIAGNOSIS — Z79899 Other long term (current) drug therapy: Secondary | ICD-10-CM

## 2014-12-10 DIAGNOSIS — Z951 Presence of aortocoronary bypass graft: Secondary | ICD-10-CM

## 2014-12-10 DIAGNOSIS — I10 Essential (primary) hypertension: Secondary | ICD-10-CM

## 2014-12-10 DIAGNOSIS — I25812 Atherosclerosis of bypass graft of coronary artery of transplanted heart without angina pectoris: Secondary | ICD-10-CM

## 2014-12-10 DIAGNOSIS — E785 Hyperlipidemia, unspecified: Secondary | ICD-10-CM

## 2014-12-10 NOTE — Telephone Encounter (Signed)
-----   Message from Tobin Chad, RN sent at 10/04/2014  3:15 PM EDT ----- Labs due in Jan 03 2015 cmp ,lipid Mail late part of sept  2016

## 2014-12-10 NOTE — Telephone Encounter (Signed)
Mailed letter and labslip 

## 2015-01-02 LAB — COMPREHENSIVE METABOLIC PANEL
ALT: 16 U/L (ref 6–29)
AST: 14 U/L (ref 10–35)
Albumin: 4.3 g/dL (ref 3.6–5.1)
Alkaline Phosphatase: 62 U/L (ref 33–130)
BUN: 15 mg/dL (ref 7–25)
CHLORIDE: 103 mmol/L (ref 98–110)
CO2: 32 mmol/L — AB (ref 20–31)
CREATININE: 0.71 mg/dL (ref 0.50–0.99)
Calcium: 9.4 mg/dL (ref 8.6–10.4)
Glucose, Bld: 100 mg/dL — ABNORMAL HIGH (ref 65–99)
POTASSIUM: 4.5 mmol/L (ref 3.5–5.3)
SODIUM: 140 mmol/L (ref 135–146)
Total Bilirubin: 0.6 mg/dL (ref 0.2–1.2)
Total Protein: 6.5 g/dL (ref 6.1–8.1)

## 2015-01-02 LAB — LIPID PANEL
Cholesterol: 201 mg/dL — ABNORMAL HIGH (ref 125–200)
HDL: 52 mg/dL (ref >=46)
LDL CALC: 109 mg/dL (ref <130)
Total CHOL/HDL Ratio: 3.9 Ratio (ref <=5.0)
Triglycerides: 200 mg/dL — ABNORMAL HIGH (ref <150)
VLDL: 40 mg/dL — ABNORMAL HIGH (ref <30)

## 2015-01-09 ENCOUNTER — Telehealth: Payer: Self-pay | Admitting: *Deleted

## 2015-01-09 DIAGNOSIS — E785 Hyperlipidemia, unspecified: Secondary | ICD-10-CM

## 2015-01-09 DIAGNOSIS — Z79899 Other long term (current) drug therapy: Secondary | ICD-10-CM

## 2015-01-09 NOTE — Telephone Encounter (Signed)
-----   Message from Marykay Lexavid W Harding, MD sent at 01/08/2015  1:37 PM EDT ----- Unfortunately, with reduced dose of Crestor, we have gone the wrong way with lipids. Total cholesterol is increased to 201 from 149. Triglycerides are elevated from 118 to 200 and LDL is up 209 to 72.  I think we need to consider going back to original dosing of Crestor. See if we can go back to daily dosing if tolerated. If not we may need to consider adding Zetia as an adjunct. If this does not work, we may need to use for more definitive treatment with PCSK-9 Inhibitor if statin side effects are too much.  Marykay LexHARDING, DAVID W, MD

## 2015-01-09 NOTE — Telephone Encounter (Signed)
LEFT MESSAGE TO CALL BACK- IN REGARD TO LAB CRESTOR 5 MG 3 X WEEK IN LAST NOTE

## 2015-01-10 MED ORDER — ROSUVASTATIN CALCIUM 5 MG PO TABS: 5.0000 mg | ORAL_TABLET | Freq: Every day | ORAL | Status: DC

## 2015-01-10 NOTE — Telephone Encounter (Signed)
Patient states she reviewed my chart aware of changes E-sent new prescription to mail order, crestor 5mg  qd #90 x 3   lab slip will be sent in Jan 2017 before next appointment.

## 2015-01-10 NOTE — Telephone Encounter (Signed)
Returning call about her lab results please call .Marland Kitchen. Thanks

## 2015-02-14 ENCOUNTER — Telehealth: Payer: Self-pay | Admitting: *Deleted

## 2015-02-14 DIAGNOSIS — E785 Hyperlipidemia, unspecified: Secondary | ICD-10-CM

## 2015-02-14 DIAGNOSIS — Z79899 Other long term (current) drug therapy: Secondary | ICD-10-CM

## 2015-02-14 NOTE — Telephone Encounter (Signed)
MAIL LETTER  WITH LAB SLIP  Lipid ,liver due by 03/19/15

## 2015-02-14 NOTE — Telephone Encounter (Signed)
-----   Message from Sharon Martin V, RN sent at 01/10/2015 11:08 AM EDT ----- Lab mail dec 2016  Due Mar 19 2015-lipid,liver 

## 2015-02-18 ENCOUNTER — Telehealth: Payer: Self-pay | Admitting: *Deleted

## 2015-02-18 NOTE — Telephone Encounter (Signed)
-----   Message from Tobin ChadSharon Jahniah Pallas V, RN sent at 01/10/2015 11:08 AM EDT ----- Lab mail dec 2016  Due Mar 19 2015-lipid,liver

## 2015-02-18 NOTE — Telephone Encounter (Signed)
Open error  Letter sent on 02/14/15

## 2015-03-12 ENCOUNTER — Encounter: Payer: Self-pay | Admitting: Cardiology

## 2015-03-14 LAB — HEPATIC FUNCTION PANEL
ALBUMIN: 4.2 g/dL (ref 3.6–5.1)
ALT: 14 U/L (ref 6–29)
AST: 13 U/L (ref 10–35)
Alkaline Phosphatase: 55 U/L (ref 33–130)
BILIRUBIN DIRECT: 0.1 mg/dL (ref <=0.2)
Indirect Bilirubin: 0.4 mg/dL (ref 0.2–1.2)
Total Bilirubin: 0.5 mg/dL (ref 0.2–1.2)
Total Protein: 6.5 g/dL (ref 6.1–8.1)

## 2015-03-14 LAB — LIPID PANEL
Cholesterol: 182 mg/dL (ref 125–200)
HDL: 56 mg/dL (ref >=46)
LDL Cholesterol: 73 mg/dL (ref <130)
TRIGLYCERIDES: 266 mg/dL — AB (ref <150)
Total CHOL/HDL Ratio: 3.3 Ratio (ref <=5.0)
VLDL: 53 mg/dL — ABNORMAL HIGH (ref <30)

## 2015-03-17 ENCOUNTER — Telehealth: Payer: Self-pay | Admitting: *Deleted

## 2015-03-17 NOTE — Telephone Encounter (Signed)
LEFT MESSAGE TO CALL BACK  APPOINTMENT ON 04/07/15

## 2015-03-17 NOTE — Telephone Encounter (Signed)
-----   Message from Marykay Lexavid W Harding, MD sent at 03/16/2015  2:26 PM EST ----- Lipid panel looks pretty good with current Crestor dosing & dosing interval. Triglycerides are up a bit, but Total Chol & LDL are down to where they were ~ 1 yr ago.  Continue current course for now.  Marykay LexHARDING, DAVID W, MD

## 2015-03-17 NOTE — Telephone Encounter (Signed)
Spoke to patient. Result given . Verbalized understanding  

## 2015-04-07 ENCOUNTER — Ambulatory Visit (INDEPENDENT_AMBULATORY_CARE_PROVIDER_SITE_OTHER): Payer: Medicare Other | Admitting: Cardiology

## 2015-04-07 ENCOUNTER — Encounter: Payer: Self-pay | Admitting: Cardiology

## 2015-04-07 VITALS — BP 160/90 | HR 60 | Ht 63.0 in | Wt 165.0 lb

## 2015-04-07 DIAGNOSIS — E785 Hyperlipidemia, unspecified: Secondary | ICD-10-CM

## 2015-04-07 DIAGNOSIS — Z79899 Other long term (current) drug therapy: Secondary | ICD-10-CM

## 2015-04-07 DIAGNOSIS — I2510 Atherosclerotic heart disease of native coronary artery without angina pectoris: Secondary | ICD-10-CM | POA: Diagnosis not present

## 2015-04-07 DIAGNOSIS — Z8679 Personal history of other diseases of the circulatory system: Secondary | ICD-10-CM

## 2015-04-07 DIAGNOSIS — I10 Essential (primary) hypertension: Secondary | ICD-10-CM | POA: Diagnosis not present

## 2015-04-07 DIAGNOSIS — R600 Localized edema: Secondary | ICD-10-CM | POA: Diagnosis not present

## 2015-04-07 DIAGNOSIS — I25810 Atherosclerosis of coronary artery bypass graft(s) without angina pectoris: Secondary | ICD-10-CM

## 2015-04-07 MED ORDER — COENZYME Q10 100 MG PO CAPS: 300.0000 mg | ORAL_CAPSULE | Freq: Every day | ORAL | Status: DC

## 2015-04-07 MED ORDER — CARVEDILOL 12.5 MG PO TABS: 12.5000 mg | ORAL_TABLET | Freq: Two times a day (BID) | ORAL | Status: DC

## 2015-04-07 NOTE — Patient Instructions (Addendum)
STOPPED METOPROLOL SUCC ( TOPROLOL) 100 MG  SWITCH TO COREG-CARVEDILOL 12.5MG  TWICE A DAY  Your physician recommends that you schedule a follow-up appointment in 1 MONTH WITH KRISTIN - BLOOD PRESSURE  STOP CRESTOR FOR ONE WEEK START Co Q10 300 MG  ( START WITH 100 MG AN ADD 100  MG DAILY UNTIL YOU REACH 100 MG AFTER TAKING 300 MG FOR A WEEK THENSTART TAKING CRESTOR Monday -Wednesday- Friday in addition.  Labs in 6 month cmp,lipid--WILL MAIL YOU THE LABSLIP   Your physician has requested that you have a LEFT lower extremity venous duplex. This test is an ultrasound of the veins in the legs. It looks at venous blood flow that carries blood from the heart to the legs. Allow one hour for a Lower Venous exam.  There are no restrictions or special instructions.     Your physician wants you to follow-up in 6 months with DR HARDING. You will receive a reminder letter in the mail two months in advance. If you don't receive a letter, please call our office to schedule the follow-up appointment.  If you need a refill on your cardiac medications before your next appointment, please call your pharmacy.

## 2015-04-07 NOTE — Progress Notes (Signed)
PCP: Lupe Carney, MD  Clinic Note: Chief Complaint  Patient presents with  . Follow-up    no chest pain, no shortness of breath, some swelling, pain L leg, cramping, no dizziness or lightheadedness  . Leg Swelling    Left greater than right, associated with pain  . Coronary Artery Disease    HPI: PRAJNA VANDERPOOL is a 70 y.o. female with a PMH below who presents today for 39month f/u CAD-CABG.  KALANY DIEKMANN was last seen in July 2016 and was doing well at that time. She had a routine follow-up Myoview ordered since her last visit.  Recent Hospitalizations: none  Studies Reviewed:  Myoview 9/'16: for Surveillance   The left ventricular ejection fraction is hyperdynamic (>65%).  Nuclear stress EF: 69%.  Blood pressure demonstrated a hypertensive response to exercise.  ST segment depression was noted during stress in the V5 and V6 leads.  This is a low risk study.  The study is normal.  Normal stress nuclear study with a small, moderate intensity, fixed distal anterior and apical defect consistent with soft tissue attenuation; no ischemia; EF 69 with normal wall motion.  Interval History: Pearley presents really without any cardiac complaints. The biggest issue she's been having for the last several months has been related to left-sided leg swelling and pain from her hip down to her lower legs. This is her walking and with sitting. She notes that being the day she has significant swelling in her left foot over down to the ankle. She then has aching and cramping associated with. She also has some cramping along the left thigh ever since restarting her S4. I had suggested that she reduce the Crestor because the cramping at the last visit, when she did this the cramping got better, but her relook lipid panel did not look to be at goal.  As for the leg swelling, she says that clinically her PCP ruled out DVT, but no Doppler was performed. He has had a history of DVT in the leg as well  as some venous insufficiency with chronic edema but nothing like this.  Otherwise from a cardiac standpoint her review of symptoms is as follows: No chest pain or shortness of breath with rest or exertion.  No PND, orthopnea  only unilateral & edema.  No palpitations, lightheadedness, dizziness, weakness or syncope/near syncope. No TIA/amaurosis fugax symptoms. No melena, hematochezia, hematuria, or epstaxis. No claudication.  ROS: A comprehensive was performed. Review of Systems  Constitutional: Negative for malaise/fatigue.  HENT: Negative for nosebleeds.   Eyes: Positive for blurred vision.  Respiratory: Negative for cough and shortness of breath.   Cardiovascular: Positive for leg swelling (left leg>>>> right). Negative for claudication.  Gastrointestinal: Negative for heartburn.  Musculoskeletal: Positive for myalgias. Negative for falls.       Left leg pain, aching burning sensation as well as cramps.  Neurological: Positive for headaches. Negative for dizziness, focal weakness, loss of consciousness and weakness.  Endo/Heme/Allergies: Does not bruise/bleed easily.  Psychiatric/Behavioral: Negative for depression and memory loss. The patient is not nervous/anxious and does not have insomnia.   All other systems reviewed and are negative.   Past Medical History  Diagnosis Date  . H/O unstable angina  2003    Cardiac cath with multivessel disease referred for CABG  . Left main coronary artery disease 2003    70% ostial left main, 100% Cx,; patent, normal RCA  . 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 Laneta Simmers)   . CAD (coronary artery disease) of bypass graft 2004/2008    Cath 2008: SVG-D1 Occluded with patent D1, LM 90%, Patent LIMA-LAD & SVG-OM1, patent Native RCA, EF ~45-50% & 2+ MR.; Myoview 2012: No ischemia or infarction, Normal EF; Echo 2013: Nl LV Fxn & normal valves   . Hypertension, benign   . Hyperlipidemia LDL goal <70     Past Surgical History    Procedure Laterality Date  . Cardiac catheterization   Novembeer 2003    70% left main, 100 % Cx @ OM; normal RCA  . Coronary artery bypass graft  November 2003    Dr. Laneta Simmers: LIMA-LAD, SVG-OM 1, SVG-D1  . Cardiac catheterization  2004, 2008    Occluded SVG-D1 with antegrade native D1 flow; LM increased from 70-90% with patent LIMA and SVG-OM1; patent RCA ; EF of 45-50% with 2+ MR   . Nm myoview ltd  2012; September 2016    a. No evidence of ischemia, normal EF;; b. Hyperdynamic LV, EF 69%. Hypertensive response to exercise. ST depressions in V5 and V6. Small, moderate intensity, fixed defect in the distal anterior and apical wall consistent with soft tissue attenuation. LOW RISK.  . Transthoracic echocardiogram  2013    Normal LV function with normal valves.  . Carotid duplex ultrasound  July 2014    Less than 49% right carotid; relatively normal left carotid.    Prior to Admission medications   Medication Sig Start Date End Date Taking? Authorizing Provider  aspirin 81 MG tablet Take 81 mg by mouth daily.   Yes Historical Provider, MD  cholecalciferol (VITAMIN D) 1000 UNITS tablet Take 1,000 Units by mouth daily.   Yes Historical Provider, MD  clopidogrel (PLAVIX) 75 MG tablet TAKE 1 TABLET DAILY 11/13/14  Yes Marykay Lex, MD  fexofenadine (ALLEGRA) 180 MG tablet Take 180 mg by mouth daily as needed for allergies or rhinitis.   Yes Historical Provider, MD  folic acid (FOLVITE) 400 MCG tablet Take 400 mcg by mouth daily.   Yes Historical Provider, MD  irbesartan-hydrochlorothiazide (AVALIDE) 300-12.5 MG per tablet Take 1 tablet by mouth daily.  10/29/13  Yes Historical Provider, MD  Magnesium 250 MG TABS Take 1 tablet by mouth daily.   Yes Historical Provider, MD  metoprolol succinate (TOPROL-XL) 100 MG 24 hr tablet Take 1 tablet (100 mg total) by mouth daily. Take with or immediately following a meal. 10/04/14  Yes Marykay Lex, MD  Multiple Vitamin (MULTIVITAMIN) capsule Take 1  capsule by mouth daily.   Yes Historical Provider, MD  Omega-3 Fatty Acids (FISH OIL) 1200 MG CAPS Take 2 capsules by mouth daily.   Yes Historical Provider, MD  potassium chloride SA (K-DUR,KLOR-CON) 20 MEQ tablet Take 1 tablet (20 mEq total) by mouth daily. 11/26/14  Yes Marykay Lex, MD  rosuvastatin (CRESTOR) 5 MG tablet Take 1 tablet (5 mg total) by mouth daily. 01/10/15  Yes Marykay Lex, MD   Allergies  Allergen Reactions  . Etodolac Anaphylaxis    migraines  . Motrin [Ibuprofen] Swelling    Throat swells  . Statins     Myalgias     Social History   Social History  . Marital Status: Married    Spouse Name: N/A  . Number of Children: N/A  . Years of Education: N/A   Social History Main Topics  . Smoking status: Never Smoker   . Smokeless tobacco: Never Used  . Alcohol Use: No  .  Drug Use: No  . Sexual Activity: Not Asked   Other Topics Concern  . None   Social History Narrative   70 year old married white woman, mother of 3, grandmother of 4.   Never smoked. Rare social alcohol.   She is a Hotel manager around the house, and does lots of the chart work.   Her husband is also a 4 patient Dr. Alanda Amass, and now follows up with Dr. Herbie Baltimore.   History reviewed. No pertinent family history.   Wt Readings from Last 3 Encounters:  04/07/15 165 lb (74.844 kg)  12/06/14 165 lb (74.844 kg)  10/04/14 165 lb 6 oz (75.014 kg)    PHYSICAL EXAM BP 160/90 mmHg  Pulse 60  Ht  (1.6 m)  Wt 165 lb (74.844 kg)  BMI 29.24 kg/m2 General appearance: alert, cooperative, appears stated age, no distress and Normal mood and affect. Answers questions appropriately. Healthy-appearing  Neck: no adenopathy, no carotid bruit, no JVD, supple, symmetrical, trachea midline and thyroid not enlarged, symmetric, no tenderness/mass/nodules  Lungs: CTAB, normal percussion bilaterally and Nonlabored, good air movement  Heart: RRR, normal S1-S2. Soft S4. 1/6 HSM at left lower sternal  border/apex. No other R./G. No HJR  Abdomen: soft, non-tender; bowel sounds normal; no masses, no organomegaly  Extremities: extremities normal, atraumatic, no cyanosis; trace right and 2-3+ left lower extremity edema with mild varicose veins noted and minimal venous stasis dermatitis noted  Pulses: 2+ and symmetric  Neurologic: Alert and oriented X 3, normal strength and tone. Normal symmetric reflexes. Normal coordination and gait    Adult ECG Report  not performed  Other studies Reviewed: Additional studies/ records that were reviewed today include:  Recent Labs:   Lab Results  Component Value Date   CHOL 182 03/13/2015   HDL 56 03/13/2015   LDLCALC 73 03/13/2015   TRIG 266* 03/13/2015   CHOLHDL 3.3 03/13/2015    ASSESSMENT / PLAN: Problem List Items Addressed This Visit    Leg edema, left - Primary    I would like to exclude a DVT definitely. Unilateral edema of this sort is very concerning for possible DVT. Plan lower extremity venous Dopplers. If negative for DVT, we will assume that this is venous stasis related and recommend compression stockings and will dose low-dose Lasix.      Relevant Orders   VAS [place] LOWER EXTREMITY VENOUS (DVT)   Comprehensive metabolic panel   Lipid panel   Left main coronary artery disease - 90% (Chronic)    She had unstable angina symptoms referred for CABG.  She had a Myoview done last fall. Negative for ischemia with what likely amounts to breast attenuation - this could also be related to the occluded vein graft the diagonal.. Low risk study. On aspirin os Plavix. Okay to stop aspirin. On beta blocker and ARB/HCTZ. She is currently on statin, "adjusting"      Relevant Medications   carvedilol (COREG) 12.5 MG tablet   Other Relevant Orders   Comprehensive metabolic panel   Lipid panel   Hyperlipidemia LDL goal <70 (Chronic)    Her lipid panel looked much better after starting the Crestor, however she is now having some cramping  sensations. Plan will be to stop Crestor for a week and low-dose on coenzyme Q 10 building up to 300 mg daily. After 300 mg times a week, then restart Crestor Monday Wednesday and Friday. - Relook lipids in 6 months.      Relevant Medications   carvedilol (COREG) 12.5  MG tablet   Other Relevant Orders   Comprehensive metabolic panel   Lipid panel   H/O unstable angina (Chronic)    No recurrent anginal symptoms. Negative Myoview for ischemia. She is on a beta blocker.      Essential hypertension (Chronic)    Blood pressures 2-day despite having increased her Toprol to 100. I will convert from Toprol to carvedilol 12.5 twice a day. On good dose of Avalide.      Relevant Medications   carvedilol (COREG) 12.5 MG tablet   Other Relevant Orders   Comprehensive metabolic panel   Lipid panel   Coronary artery disease involving coronary bypass graft - occluded SVG-D1 (Chronic)    This may be what resulted in the perfusion defect on Myoview.      Relevant Medications   carvedilol (COREG) 12.5 MG tablet    Other Visit Diagnoses    Drug therapy        Relevant Orders    Comprehensive metabolic panel    Lipid panel       Current medicines are reviewed at length with the patient today. (+/- concerns) cramps with Crestor The following changes have been made:  STOPPED METOPROLOL SUCC ( TOPROLOL) 100 MG  SWITCH TO COREG-CARVEDILOL 12.5MG  TWICE A DAY  Your physician recommends that you schedule a follow-up appointment in 1 MONTH WITH KRISTIN - BLOOD PRESSURE  STOP CRESTOR FOR ONE WEEK START Co Q10 300 MG  ( START WITH 100 MG AN ADD 100  MG DAILY UNTIL YOU REACH 300 MG AFTER TAKING 300 MG FOR A WEEK THENSTART TAKING CRESTOR Monday -Wednesday- Friday in addition.  Studies Ordered:   Labs in 6 month cmp,lipid--WILL MAIL YOU THE LABSLIP  Your physician has requested that you have a LEFT lower extremity venous duplex  Orders Placed This Encounter  Procedures  . Comprehensive  metabolic panel  . Lipid panel    Follow-up in 6 months with DR HARDING.  Marykay Lex, M.D., M.S. Interventional Cardiologist   Pager # [phone] Phone # [phone] [address] [place], [place] [address]

## 2015-04-08 ENCOUNTER — Encounter: Payer: Self-pay | Admitting: Cardiology

## 2015-04-08 DIAGNOSIS — R600 Localized edema: Secondary | ICD-10-CM

## 2015-04-08 HISTORY — DX: Localized edema: R60.0

## 2015-04-08 NOTE — Assessment & Plan Note (Signed)
She had unstable angina symptoms referred for CABG.  She had a Myoview done last fall. Negative for ischemia with what likely amounts to breast attenuation - this could also be related to the occluded vein graft the diagonal.. Low risk study. On aspirin os Plavix. Okay to stop aspirin. On beta blocker and ARB/HCTZ. She is currently on statin, "adjusting"

## 2015-04-08 NOTE — Assessment & Plan Note (Signed)
Blood pressures 2-day despite having increased her Toprol to 100. I will convert from Toprol to carvedilol 12.5 twice a day. On good dose of Avalide.

## 2015-04-08 NOTE — Assessment & Plan Note (Signed)
Her lipid panel looked much better after starting the Crestor, however she is now having some cramping sensations. Plan will be to stop Crestor for a week and low-dose on coenzyme Q 10 building up to 300 mg daily. After 300 mg times a week, then restart Crestor Monday Wednesday and Friday. - Relook lipids in 6 months.

## 2015-04-08 NOTE — Assessment & Plan Note (Signed)
I would like to exclude a DVT definitely. Unilateral edema of this sort is very concerning for possible DVT. Plan lower extremity venous Dopplers. If negative for DVT, we will assume that this is venous stasis related and recommend compression stockings and will dose low-dose Lasix.

## 2015-04-08 NOTE — Assessment & Plan Note (Signed)
No recurrent anginal symptoms. Negative Myoview for ischemia. She is on a beta blocker.

## 2015-04-08 NOTE — Assessment & Plan Note (Signed)
This may be what resulted in the perfusion defect on Myoview.

## 2015-04-10 ENCOUNTER — Ambulatory Visit (HOSPITAL_COMMUNITY)
Admission: RE | Admit: 2015-04-10 | Discharge: 2015-04-10 | Disposition: A | Discharge status: Home / Self Care | Payer: Medicare Other | Source: Ambulatory Visit | Attending: Internal Medicine | Admitting: Internal Medicine

## 2015-04-10 DIAGNOSIS — R600 Localized edema: Secondary | ICD-10-CM | POA: Insufficient documentation

## 2015-04-16 ENCOUNTER — Telehealth: Payer: Self-pay | Admitting: *Deleted

## 2015-04-16 NOTE — Telephone Encounter (Signed)
Spoke to patient.  DOPPLERResult given . Verbalized understanding

## 2015-04-16 NOTE — Telephone Encounter (Signed)
-----   Message from Marykay Lex, MD sent at 04/15/2015  6:29 PM EST ----- LE Venous Doppler -- no evidence of DVT(blood clot), as hoped.  Marykay Lex, MD

## 2015-05-08 ENCOUNTER — Encounter: Payer: Self-pay | Admitting: Pharmacist Clinician (PhC)/ Clinical Pharmacy Specialist

## 2015-05-08 ENCOUNTER — Ambulatory Visit (INDEPENDENT_AMBULATORY_CARE_PROVIDER_SITE_OTHER): Payer: Medicare Other | Admitting: Pharmacist Clinician (PhC)/ Clinical Pharmacy Specialist

## 2015-05-08 VITALS — BP 158/86 | HR 72 | Ht 63.0 in | Wt 166.1 lb

## 2015-05-08 DIAGNOSIS — I10 Essential (primary) hypertension: Secondary | ICD-10-CM

## 2015-05-08 MED ORDER — CARVEDILOL 25 MG PO TABS: 25.0000 mg | ORAL_TABLET | Freq: Two times a day (BID) | ORAL | Status: DC

## 2015-05-08 MED ORDER — FUROSEMIDE 20 MG PO TABS: ORAL_TABLET | ORAL | Status: DC

## 2015-05-08 NOTE — Progress Notes (Signed)
05/08/2015 [name] [birth year] [id number]   HPI:  [name] is a 70 y.o. female patient of Dr Herbie Baltimore, with a PMH below who presents today for hypertension clinic evaluation.  When she saw Dr. Herbie Baltimore her BP was elevated at 160/90 and he switched her metoprolol to carvedilol.  Since then she reports some increase in palpitations and fatigue.  She noted some left LEE and dopplers were negative for blockages.    Cardiac Hx: CABG at 48, LEE, HTN  Family Hx: mother has "extremely" high BP, tolerates only atenolol, LEE,  currently 16 years old; father died at 9 with carotid disease, MI, prior CABG; daughter has blockages at age 27  Social Hx: no tobacco or alcohol; rare alcohol, drinks 1 cup of coffee/day  Exercise: no specific exercise, tries to stay active  Home BP readings: none  Current antihypertensive medications: carvedilol 12.5 mg bid, irbesartan hctz 300/12.5   Wt Readings from Last 3 Encounters:  05/08/15 166 lb 1.6 oz (75.342 kg)  04/07/15 165 lb (74.844 kg)  12/06/14 165 lb (74.844 kg)   BP Readings from Last 3 Encounters:  05/08/15 158/86  04/07/15 160/90  10/04/14 160/76   Pulse Readings from Last 3 Encounters:  05/08/15 72  04/07/15 60  10/04/14 79    Current Outpatient Prescriptions  Medication Sig Dispense Refill  . aspirin 81 MG tablet Take 81 mg by mouth daily.    . carvedilol (COREG) 25 MG tablet Take 1 tablet (25 mg total) by mouth 2 (two) times daily. 60 tablet 3  . cholecalciferol (VITAMIN D) 1000 UNITS tablet Take 1,000 Units by mouth daily.    . clopidogrel (PLAVIX) 75 MG tablet TAKE 1 TABLET DAILY 90 tablet 2  . Coenzyme Q10 100 MG capsule Take 3 capsules (300 mg total) by mouth daily. 90 capsule 11  . fexofenadine (ALLEGRA) 180 MG tablet Take 180 mg by mouth daily as needed for allergies or rhinitis.    . folic acid (FOLVITE) 400 MCG tablet Take 400 mcg by mouth daily.    . furosemide (LASIX) 20 MG tablet Take 1 tablet by mouth daily as  needed 90 tablet 3  . irbesartan-hydrochlorothiazide (AVALIDE) 300-12.5 MG per tablet Take 1 tablet by mouth daily.     . Magnesium 250 MG TABS Take 1 tablet by mouth daily.    . Multiple Vitamin (MULTIVITAMIN) capsule Take 1 capsule by mouth daily.    . Omega-3 Fatty Acids (FISH OIL) 1200 MG CAPS Take 2 capsules by mouth daily.    . potassium chloride SA (K-DUR,KLOR-CON) 20 MEQ tablet Take 1 tablet (20 mEq total) by mouth daily. 90 tablet 3  . rosuvastatin (CRESTOR) 5 MG tablet Take 1 tablet (5 mg total) by mouth daily. 90 tablet 3   No current facility-administered medications for this visit.    Allergies  Allergen Reactions  . Etodolac Anaphylaxis    migraines  . Motrin [Ibuprofen] Swelling    Throat swells  . Statins     Myalgias     Past Medical History  Diagnosis Date  . H/O unstable angina  2003    Cardiac cath with multivessel disease referred for CABG  . Left main coronary artery disease 2003    70% ostial left main, 100% Cx,; patent, normal RCA  . 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 Laneta Simmers)   . CAD (coronary artery disease) of bypass graft 2004/2008    Cath 2008: SVG-D1 Occluded  with patent D1, LM 90%, Patent LIMA-LAD & SVG-OM1, patent Native RCA, EF ~45-50% & 2+ MR.; Myoview 2012: No ischemia or infarction, Normal EF; Echo 2013: Nl LV Fxn & normal valves   . Hypertension, benign   . Hyperlipidemia LDL goal <70     Blood pressure 158/86, pulse 72, height  (1.6 m), weight 166 lb 1.6 oz (75.342 kg).    Phillips Hay PharmD CPP [facility]

## 2015-05-08 NOTE — Assessment & Plan Note (Signed)
Blood pressure still above goal today at 158/86.  Because she still has some palpitations, I am going to increase her carvedilol from 12.5 to 25 mg bid.  I am also going to add furosemide 20 mg for her to take as needed for LEE.  I will see her back in 3-4 weeks for follow up.  Will have her repeat BMET at that time

## 2015-05-08 NOTE — Patient Instructions (Signed)
Return for a a follow up appointment in 1 month  Your blood pressure today is 158/86  (goal is <150/90)  Check your blood pressure at home daily and keep record of the readings.  Take your BP meds as follows: increase carvedilol to 25 mg bid, add furosemide 20 mg as needed for edema  Bring all of your meds, your BP cuff and your record of home blood pressures to your next appointment.  Exercise as you're able, try to walk approximately 30 minutes per day.  Keep salt intake to a minimum, especially watch canned and prepared boxed foods.  Eat more fresh fruits and vegetables and fewer canned items.  Avoid eating in fast food restaurants.    HOW TO TAKE YOUR BLOOD PRESSURE: . Rest 5 minutes before taking your blood pressure. .  Don't smoke or drink caffeinated beverages for at least 30 minutes before. . Take your blood pressure before (not after) you eat. . Sit comfortably with your back supported and both feet on the floor (don't cross your legs). . Elevate your arm to heart level on a table or a desk. . Use the proper sized cuff. It should fit smoothly and snugly around your bare upper arm. There should be enough room to slip a fingertip under the cuff. The bottom edge of the cuff should be 1 inch above the crease of the elbow. . Ideally, take 3 measurements at one sitting and record the average.

## 2015-06-05 ENCOUNTER — Ambulatory Visit (INDEPENDENT_AMBULATORY_CARE_PROVIDER_SITE_OTHER): Payer: Medicare Other | Admitting: Pharmacist Clinician (PhC)/ Clinical Pharmacy Specialist

## 2015-06-05 ENCOUNTER — Encounter: Payer: Self-pay | Admitting: Pharmacist Clinician (PhC)/ Clinical Pharmacy Specialist

## 2015-06-05 VITALS — BP 138/72 | HR 80 | Ht 63.0 in | Wt 166.8 lb

## 2015-06-05 DIAGNOSIS — I10 Essential (primary) hypertension: Secondary | ICD-10-CM

## 2015-06-05 LAB — BASIC METABOLIC PANEL
BUN: 19 mg/dL (ref 7–25)
CHLORIDE: 102 mmol/L (ref 98–110)
CO2: 28 mmol/L (ref 20–31)
Calcium: 9.7 mg/dL (ref 8.6–10.4)
Creat: 0.72 mg/dL (ref 0.50–0.99)
Glucose, Bld: 94 mg/dL (ref 65–99)
Potassium: 3.7 mmol/L (ref 3.5–5.3)
Sodium: 141 mmol/L (ref 135–146)

## 2015-06-05 NOTE — Progress Notes (Signed)
06/05/2015 [name] [birth year] [id number]   HPI:  Pamela Saxonlice M Chaput is a 70 y.o. female patient of Dr Herbie BaltimoreHarding, with a PMH below who presents today for hypertension clinic follow up.  When she saw Dr. Herbie BaltimoreHarding her BP was elevated at 160/90 and he switched her metoprolol to carvedilol.  Since then she reports some increase in palpitations and fatigue.  She noted some left LEE, but dopplers were negative for blockages.  When I saw her at the beginning of March, I increased her carvedilol from 12.5 to 25 mg twice daily.  She reports no problems other than some fatigue, but doesn't notice that until she sits down after being active for much of the day.  I also added furosemide 20 mg for her to take prn and recommended she look into compression stockings.  Today she reports that she took the furosemide daily, and she did get medium compression stockings.  She was unable to get them on and off, so switched to the mild compression and has been wearing daily.  She does note some occasional orthostatic symptoms when getting out of bed in the mornings.   Cardiac Hx: CABG at 48, LEE, HTN  Family Hx: mother has "extremely" high BP, tolerates only atenolol, LEE,  currently 70 years old; father died at 7972 with carotid disease, MI, prior CABG; daughter has blockages at age 70  Social Hx: no tobacco or alcohol; rare alcohol, drinks 1 cup of coffee/day  Exercise: no specific exercise, tries to stay active  Home BP readings: 23 readings in past month -  only 4 at or above 140 and only 1 greater than 150 systolic (151); all diastolic readings WNL; patient brought cuff into office today, read 10 points higher than office cuff  Current antihypertensive medications: carvedilol 25 mg bid, irbesartan hctz 300/12.5   Wt Readings from Last 3 Encounters:  06/05/15 166 lb 12.8 oz (75.66 kg)  05/08/15 166 lb 1.6 oz (75.342 kg)  04/07/15 165 lb (74.844 kg)   BP Readings from Last 3 Encounters:  06/05/15 138/72    05/08/15 158/86  04/07/15 160/90   Pulse Readings from Last 3 Encounters:  06/05/15 80  05/08/15 72  04/07/15 60    Current Outpatient Prescriptions  Medication Sig Dispense Refill  . aspirin 81 MG tablet Take 81 mg by mouth daily.    . carvedilol (COREG) 25 MG tablet Take 1 tablet (25 mg total) by mouth 2 (two) times daily. 60 tablet 3  . cholecalciferol (VITAMIN D) 1000 UNITS tablet Take 1,000 Units by mouth daily.    . clopidogrel (PLAVIX) 75 MG tablet TAKE 1 TABLET DAILY 90 tablet 2  . Coenzyme Q10 100 MG capsule Take 3 capsules (300 mg total) by mouth daily. 90 capsule 11  . fexofenadine (ALLEGRA) 180 MG tablet Take 180 mg by mouth daily as needed for allergies or rhinitis.    . folic acid (FOLVITE) 400 MCG tablet Take 400 mcg by mouth daily.    . furosemide (LASIX) 20 MG tablet Take 1 tablet by mouth daily as needed 90 tablet 3  . irbesartan-hydrochlorothiazide (AVALIDE) 300-12.5 MG per tablet Take 1 tablet by mouth daily.     . Magnesium 250 MG TABS Take 1 tablet by mouth daily.    . Multiple Vitamin (MULTIVITAMIN) capsule Take 1 capsule by mouth daily.    . Omega-3 Fatty Acids (FISH OIL) 1200 MG CAPS Take 2 capsules by mouth daily.    . potassium chloride  SA (K-DUR,KLOR-CON) 20 MEQ tablet Take 1 tablet (20 mEq total) by mouth daily. 90 tablet 3  . rosuvastatin (CRESTOR) 5 MG tablet Take 1 tablet (5 mg total) by mouth daily. 90 tablet 3   No current facility-administered medications for this visit.    Allergies  Allergen Reactions  . Etodolac Anaphylaxis    migraines  . Motrin [Ibuprofen] Swelling    Throat swells  . Statins     Myalgias     Past Medical History  Diagnosis Date  . H/O unstable angina  2003    Cardiac cath with multivessel disease referred for CABG  . Left main coronary artery disease 2003    70% ostial left main, 100% Cx,; patent, normal RCA  . 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 Laneta Simmers)   . CAD (coronary artery disease)  of bypass graft 2004/2008    Cath 2008: SVG-D1 Occluded with patent D1, LM 90%, Patent LIMA-LAD & SVG-OM1, patent Native RCA, EF ~45-50% & 2+ MR.; Myoview 2012: No ischemia or infarction, Normal EF; Echo 2013: Nl LV Fxn & normal valves   . Hypertension, benign   . Hyperlipidemia LDL goal <70     Blood pressure 138/72, pulse 80, height  (1.6 m), weight 166 lb 12.8 oz (75.66 kg).    Phillips Hay PharmD CPP [facility]

## 2015-06-05 NOTE — Patient Instructions (Signed)
Your blood pressure today is 138/72  (goal is to be < 150/90)   Check your blood pressure at home several times each week and keep record of the readings.  Take your BP meds as follows:continue with your current medications; try to take the furosemide as needed for swelling, as opposed to every day;  Continue with support stockings  Bring all of your meds, your BP cuff and your record of home blood pressures to your next appointment.  Exercise as you're able, try to walk approximately 30 minutes per day.  Keep salt intake to a minimum, especially watch canned and prepared boxed foods.  Eat more fresh fruits and vegetables and fewer canned items.  Avoid eating in fast food restaurants.    HOW TO TAKE YOUR BLOOD PRESSURE: . Rest 5 minutes before taking your blood pressure. .  Don't smoke or drink caffeinated beverages for at least 30 minutes before. . Take your blood pressure before (not after) you eat. . Sit comfortably with your back supported and both feet on the floor (don't cross your legs). . Elevate your arm to heart level on a table or a desk. . Use the proper sized cuff. It should fit smoothly and snugly around your bare upper arm. There should be enough room to slip a fingertip under the cuff. The bottom edge of the cuff should be 1 inch above the crease of the elbow. . Ideally, take 3 measurements at one sitting and record the average.

## 2015-06-05 NOTE — Assessment & Plan Note (Signed)
Today her BP looks good.  All home readings also WNL.  Will have her continue with her current medications.  I did ask that she try taking the furosemide only prn rather than daily, and I will send her to lab today for repeat BMET.  She should continue with compression stockings as well.  Advised that she continue with home BP monitoring and call should she note an increase in readings to > 150 on multiple occasions.

## 2015-06-10 ENCOUNTER — Telehealth: Payer: Self-pay | Admitting: Pharmacist Clinician (PhC)/ Clinical Pharmacy Specialist

## 2015-06-10 MED ORDER — METOPROLOL SUCCINATE ER 100 MG PO TB24: 100.0000 mg | ORAL_TABLET | Freq: Every day | ORAL | Status: DC

## 2015-06-10 NOTE — Telephone Encounter (Signed)
Itching across chest, upper arms, back and now moving down to legs/buttocks.  No rash, just severe, intense itch.  Increased carvedilol from 12.5 to 25 mg over weekend.  Advised that she stop carvedilol and wait 2 days before restarting metoprolol xl 100 mg, so we can be sure this was the cause of itching.  Patient aware to call if itch does not resolve.

## 2015-06-10 NOTE — Telephone Encounter (Signed)
Pt c/o medication issue:  1. Name of Medication: Carvedilol 2. How are you currently taking this medication (dosage and times per day)? 25mg  2x day  3. Are you having a reaction (difficulty breathing--STAT)? no 4. What is your medication issue? Pt has severe itching all over her body

## 2015-06-13 ENCOUNTER — Telehealth: Payer: Self-pay | Admitting: *Deleted

## 2015-06-13 NOTE — Telephone Encounter (Signed)
Spoke to patient. Result given . Verbalized understanding  

## 2015-06-13 NOTE — Telephone Encounter (Signed)
-----   Message from Marykay Lexavid W Harding, MD sent at 06/09/2015  5:56 PM EDT ----- Chemistry panel ' kidney function look great.  Marykay LexHARDING, DAVID W, MD

## 2015-08-08 ENCOUNTER — Other Ambulatory Visit: Payer: Self-pay | Admitting: Cardiology

## 2015-08-08 NOTE — Telephone Encounter (Signed)
Rx(s) sent to pharmacy electronically.  

## 2015-09-19 ENCOUNTER — Telehealth: Payer: Self-pay | Admitting: Cardiology

## 2015-09-19 NOTE — Telephone Encounter (Signed)
New message    Pt was offered the next available appt but refused. Did not want to see a PA. Please call.

## 2015-09-25 ENCOUNTER — Other Ambulatory Visit: Payer: Self-pay | Admitting: Family Medicine

## 2015-09-25 DIAGNOSIS — Z1231 Encounter for screening mammogram for malignant neoplasm of breast: Secondary | ICD-10-CM

## 2015-10-10 ENCOUNTER — Ambulatory Visit
Admission: RE | Admit: 2015-10-10 | Discharge: 2015-10-10 | Disposition: A | Discharge status: Home / Self Care | Payer: Medicare Other | Source: Ambulatory Visit | Attending: Family Medicine | Admitting: Family Medicine

## 2015-10-10 DIAGNOSIS — Z1231 Encounter for screening mammogram for malignant neoplasm of breast: Secondary | ICD-10-CM

## 2015-12-17 ENCOUNTER — Other Ambulatory Visit: Payer: Self-pay | Admitting: Cardiology

## 2015-12-18 NOTE — Telephone Encounter (Signed)
Rx request sent to pharmacy.  

## 2015-12-23 ENCOUNTER — Ambulatory Visit (INDEPENDENT_AMBULATORY_CARE_PROVIDER_SITE_OTHER): Payer: Medicare Other | Admitting: Cardiology

## 2015-12-23 ENCOUNTER — Encounter: Payer: Self-pay | Admitting: Cardiology

## 2015-12-23 VITALS — BP 150/72 | HR 71 | Ht 64.0 in | Wt 165.0 lb

## 2015-12-23 DIAGNOSIS — Z23 Encounter for immunization: Secondary | ICD-10-CM | POA: Diagnosis not present

## 2015-12-23 DIAGNOSIS — E785 Hyperlipidemia, unspecified: Secondary | ICD-10-CM

## 2015-12-23 DIAGNOSIS — I10 Essential (primary) hypertension: Secondary | ICD-10-CM

## 2015-12-23 DIAGNOSIS — I2510 Atherosclerotic heart disease of native coronary artery without angina pectoris: Secondary | ICD-10-CM | POA: Diagnosis not present

## 2015-12-23 DIAGNOSIS — R600 Localized edema: Secondary | ICD-10-CM | POA: Diagnosis not present

## 2015-12-23 DIAGNOSIS — I25810 Atherosclerosis of coronary artery bypass graft(s) without angina pectoris: Secondary | ICD-10-CM | POA: Diagnosis not present

## 2015-12-23 DIAGNOSIS — R42 Dizziness and giddiness: Secondary | ICD-10-CM

## 2015-12-23 DIAGNOSIS — Z951 Presence of aortocoronary bypass graft: Secondary | ICD-10-CM

## 2015-12-23 LAB — LIPID PANEL
CHOL/HDL RATIO: 3.7 ratio (ref <=5.0)
CHOLESTEROL: 182 mg/dL (ref 125–200)
HDL: 49 mg/dL (ref >=46)
LDL Cholesterol: 90 mg/dL (ref <130)
TRIGLYCERIDES: 215 mg/dL — AB (ref <150)
VLDL: 43 mg/dL — AB (ref <30)

## 2015-12-23 LAB — COMPREHENSIVE METABOLIC PANEL
ALBUMIN: 4 g/dL (ref 3.6–5.1)
ALK PHOS: 57 U/L (ref 33–130)
ALT: 14 U/L (ref 6–29)
AST: 15 U/L (ref 10–35)
BUN: 18 mg/dL (ref 7–25)
CHLORIDE: 101 mmol/L (ref 98–110)
CO2: 27 mmol/L (ref 20–31)
Calcium: 9 mg/dL (ref 8.6–10.4)
Creat: 0.77 mg/dL (ref 0.60–0.93)
Glucose, Bld: 105 mg/dL — ABNORMAL HIGH (ref 65–99)
POTASSIUM: 3.9 mmol/L (ref 3.5–5.3)
Sodium: 141 mmol/L (ref 135–146)
Total Bilirubin: 0.5 mg/dL (ref 0.2–1.2)
Total Protein: 6.3 g/dL (ref 6.1–8.1)

## 2015-12-23 NOTE — Assessment & Plan Note (Signed)
Better with walking around a lot. In the less humid climate. Continue to recommend using support stockings. She prefers to not use Lasix because it makes her feel bad.

## 2015-12-23 NOTE — Assessment & Plan Note (Signed)
Doing better. Notes more in the summertime.

## 2015-12-23 NOTE — Assessment & Plan Note (Signed)
Stable without any active angina symptoms. On stable current regimen.

## 2015-12-23 NOTE — Assessment & Plan Note (Signed)
Still not quite at goal. Plan is to check her chemistry panel along with lipids today. If potassium is not too high, would likely add spironolactone.

## 2015-12-23 NOTE — Assessment & Plan Note (Signed)
Doing very well. No active anginal symptoms. She had a perfusion defect on Myoview likely related to the occluded SVG-D1. Plan: Continue aspirin, Plavix, beta blocker, ACE inhibitor, along with statin

## 2015-12-23 NOTE — Assessment & Plan Note (Signed)
Cholesterol panel back in January looked good. She is now taking every other day statin. Due for labs today. Also taking coenzyme Q 10.

## 2015-12-23 NOTE — Progress Notes (Signed)
PCP: Lupe Carney, MD  Clinic Note: Chief Complaint  Patient presents with  . Follow-up    6 MONTHS  . Shortness of Breath  . Edema    HPI: [name] is a 70 y.o. female with a PMH below who presents today for 2month f/u CAD-CABG.  [name] was last seen in January 2017 and was doing well at that time. She noted mostly left-sided leg swelling. Also is starting Crestor. Now with reduced Crestor was not at goal. STOPPED METOPROLOL SUCC ( TOPROLOL) 100 MG --> SWITCH TO COREG-CARVEDILOL 12.5MG  TWICE A DAY  1 MONTH WITH KRISTIN - BLOOD PRESSURE  STOP CRESTOR FOR ONE WEEK START Co Q10 300 MG  ( START WITH 100 MG AN ADD 100  MG DAILY UNTIL YOU REACH 300 MG AFTER TAKING 300 MG FOR A WEEK THENSTART TAKING CRESTOR Monday -Wednesday- Friday in addition   She is followed by [facility] for her blood pressure with 2 visits in March. Her blood pressures on March 30 were stable. The plan was for her to take her Lasix on a when necessary basis.She had a bad rash reaction with increasing carvedilol from 12.5-25 mg twice a day  Recent Hospitalizations: none  Studies Reviewed:   Lower extremity venous Doppler February : No evidence of DVT  Interval History: [name] presents really without any cardiac complaints. She really notes that the swelling in her legs improved. It may be related to the fact that she is has been dealing with a lot of pain in that left knee that limits her from walking. She actually was in [place] for entire month and really had minimal swelling then. She's only been taking her Lasix very rarely. She did note having a little bit short of breath when climbing out tubes, but otherwise did well. Occasional positional dizziness and with significant exertion..  Otherwise from a cardiac standpoint her review of symptoms is as follows: No chest pain or shortness of breath with rest or exertion.  No PND, orthopnea  only unilateral & edema.  No palpitations,  lightheadedness, weakness or syncope/near syncope. No TIA/amaurosis fugax symptoms. No melena, hematochezia, hematuria, or epstaxis. No claudication.  ROS: A comprehensive was performed. Review of Systems  Constitutional: Negative for malaise/fatigue.  HENT: Negative for nosebleeds.   Respiratory: Negative for cough and shortness of breath.   Cardiovascular: Positive for leg swelling (left leg>>>> right). Negative for claudication.  Gastrointestinal: Negative for heartburn.  Musculoskeletal: Positive for joint pain (Left knee). Negative for falls and myalgias (Cramps are much better).  Neurological: Negative for dizziness, focal weakness, loss of consciousness, weakness and headaches.  Endo/Heme/Allergies: Does not bruise/bleed easily.  Psychiatric/Behavioral: Negative for depression and memory loss. The patient is not nervous/anxious and does not have insomnia.   All other systems reviewed and are negative.   Past Medical History:  Diagnosis Date  . CAD (coronary artery disease) of bypass graft 2004/2008   Cath 2008: SVG-D1 Occluded with patent D1, LM 90%, Patent LIMA-LAD & SVG-OM1, patent Native RCA, EF ~45-50% & 2+ MR.; Myoview 2012: No ischemia or infarction, Normal EF; Echo 2013: Nl LV Fxn & normal valves   . H/O unstable angina  2003   Cardiac cath with multivessel disease referred for CABG  . Hyperlipidemia LDL goal <70   . Hypertension, benign   . Left main coronary artery disease 2003   70% ostial left main, 100% Cx,; patent, normal RCA  . 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 Laneta Simmers)     Past Surgical History:  Procedure Laterality Date  . CARDIAC CATHETERIZATION   Novembeer 2003   70% left main, 100 % Cx @ OM; normal RCA  . CARDIAC CATHETERIZATION  2004, 2008   Occluded SVG-D1 with antegrade native D1 flow; LM increased from 70-90% with patent LIMA and SVG-OM1; patent RCA ; EF of 45-50% with 2+ MR   . Carotid Duplex Ultrasound  July 2014   Less  than 49% right carotid; relatively normal left carotid.  . CORONARY ARTERY BYPASS GRAFT  November 2003   Dr. Laneta Simmers: LIMA-LAD, SVG-OM 1, SVG-D1  . NM MYOVIEW LTD  2012; September 2016   a. No evidence of ischemia, normal EF;; b. Hyperdynamic LV, EF 69%. Hypertensive response to exercise. ST depressions in V5 and V6. Small, moderate intensity, fixed defect in the distal anterior and apical wall consistent with soft tissue attenuation. LOW RISK.  [facility] Kitchen TRANSTHORACIC ECHOCARDIOGRAM  2013   Normal LV function with normal valves.    Prior to Admission medications   Medication Sig Start Date Taking? Authorizing Provider  aspirin 81 MG tablet Take 81 mg by mouth daily.  Yes Historical Provider, MD  cholecalciferol (VITAMIN D) 1000 UNITS tablet Take 1,000 Units by mouth daily.  Yes Historical Provider, MD  clopidogrel (PLAVIX) 75 MG tablet TAKE 1 TABLET DAILY 08/08/15 Yes Marykay Lex, MD  Coenzyme Q10 100 MG capsule Take 3 capsules (300 mg total) by mouth daily. Patient taking differently: Take 100 mg by mouth daily.  04/07/15 Yes Marykay Lex, MD  fexofenadine (ALLEGRA) 180 MG tablet Take 180 mg by mouth daily as needed for allergies or rhinitis.  Yes Historical Provider, MD  folic acid (FOLVITE) 400 MCG tablet Take 400 mcg by mouth daily.  Yes Historical Provider, MD  furosemide (LASIX) 20 MG tablet Take 1 tablet by mouth daily as needed 05/08/15 Yes Marykay Lex, MD  irbesartan-hydrochlorothiazide (AVALIDE) 300-12.5 MG per tablet Take 1 tablet by mouth daily.  10/29/13 Yes Historical Provider, MD  Magnesium 250 MG TABS Take 1 tablet by mouth daily.  Yes Historical Provider, MD  metoprolol succinate (TOPROL-XL) 100 MG 24 hr tablet Take 1 tablet (100 mg total) by mouth daily. Take with or immediately following a meal. 06/10/15 Yes Marykay Lex, MD  Multiple Vitamin (MULTIVITAMIN) capsule Take 1 capsule by mouth daily.  Yes Historical Provider, MD  Omega-3 Fatty Acids (FISH OIL) 1200 MG CAPS Take 2  capsules by mouth daily.  Yes Historical Provider, MD  potassium chloride SA (K-DUR,KLOR-CON) 20 MEQ tablet TAKE 1 TABLET DAILY 12/18/15 Yes Marykay Lex, MD  rosuvastatin (CRESTOR) 5 MG tablet Take 1 tablet (5 mg total) by mouth daily. Patient taking differently: Take 5 mg by mouth daily. TAKE THREE TIMES A WEEK. 01/10/15 Yes Marykay Lex, MD    Allergies  Allergen Reactions  . Etodolac Anaphylaxis    migraines  . Motrin [Ibuprofen] Swelling    Throat swells  . Statins     Myalgias   . Carvedilol Rash    Social History   Social History  . Marital status: Married    Spouse name: N/A  . Number of children: N/A  . Years of education: N/A   Social History Main Topics  . Smoking status: Never Smoker  . Smokeless tobacco: Never Used  . Alcohol use No  . Drug use: No  . Sexual activity: Not Asked   Other Topics Concern  . None  Social History Narrative   70 year old married white woman, mother of 3, grandmother of 4.   Never smoked. Rare social alcohol.   She is a Hotel manager around the house, and does lots of the chart work.   Her husband is also a 4 patient Dr. Alanda Amass, and now follows up with Dr. Herbie Baltimore.   History reviewed. No pertinent family history.  Wt Readings from Last 3 Encounters:  12/23/15 74.8 kg (165 lb)  06/05/15 75.7 kg (166 lb 12.8 oz)  05/08/15 75.3 kg (166 lb 1.6 oz)    PHYSICAL EXAM BP (!) 150/72   Pulse 71   Ht 5\' 4"  (1.626 m)   Wt 74.8 kg (165 lb)   BMI 28.32 kg/m  General appearance: alert, cooperative, appears stated age, no distress and Normal mood and affect. Answers questions appropriately. Healthy-appearing  Neck: no adenopathy, no carotid bruit, no JVD, supple, symmetrical, trachea midline and thyroid not enlarged, symmetric, no tenderness/mass/nodules  Lungs: CTAB, normal percussion bilaterally and Nonlabored, good air movement  Heart: RRR, normal S1-S2. Soft S4. 1/6 HSM at left lower sternal border/apex. No other R./G. No  HJR  Abdomen: soft, non-tender; bowel sounds normal; no masses, no organomegaly  Extremities: extremities normal, atraumatic, no cyanosis; trace right and 2-3+ left lower extremity edema with mild varicose veins noted and minimal venous stasis dermatitis noted  Pulses: 2+ and symmetric  Neurologic: Alert and oriented X 3, normal strength and tone. Normal symmetric reflexes. Normal coordination and gait    Adult ECG Report Normal sinus rhythm, rate 71 BPM. Normal axis, intervals and durations. Stable EKG.  Other studies Reviewed: Additional studies/ records that were reviewed today include:  Recent Labs:   Lab Results  Component Value Date   CHOL 182 03/13/2015   HDL 56 03/13/2015   LDLCALC 73 03/13/2015   TRIG 266 (H) 03/13/2015   CHOLHDL 3.3 03/13/2015    ASSESSMENT / PLAN: Problem List Items Addressed This Visit    S/P CABG x 3;  LIMA-LAD, SVG-OM1, SVG-D1 (occluded) (Chronic)   Relevant Orders   EKG 12-Lead   Postural dizziness    Doing better. Notes more in the summertime.      Left main coronary artery disease - 90% (Chronic)    Stable without any active angina symptoms. On stable current regimen.      Relevant Orders   EKG 12-Lead   Hyperlipidemia LDL goal <70 (Chronic)    Cholesterol panel back in January looked good. She is now taking every other day statin. Due for labs today. Also taking coenzyme Q 10.      Relevant Orders   Lipid panel   Essential hypertension (Chronic)    Still not quite at goal. Plan is to check her chemistry panel along with lipids today. If potassium is not too high, would likely add spironolactone.      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 No active anginal symptoms. She had a perfusion defect on Myoview likely related to the occluded SVG-D1. Plan: Continue aspirin, Plavix, beta blocker,  ACE inhibitor, along with statin       Relevant Orders   EKG 12-Lead   Bilateral lower extremity edema - mild, likely venous stasis (Chronic)    Better with walking around a lot. In the less humid climate. Continue to recommend using support stockings. She prefers to not use  Lasix because it makes her feel bad.       Other Visit Diagnoses    Encounter for immunization       Relevant Orders   Flu Vaccine QUAD 36+ mos IM (Completed)     Flu shot given today  Current medicines are reviewed at length with the patient today. (+/- concerns) cramps with Crestor The following changes have been made:  Based on what labs look like today, with consider adding Spiriva lactone for blood pressure.   Orders Placed This Encounter  Procedures  . Flu Vaccine QUAD 36+ mos IM  . Comprehensive metabolic panel  . Lipid panel  . EKG 12-Lead    Follow-up in 6 months with APP, 1 YR with Dr Herbie BaltimoreHARDING.  Bryan Lemmaavid Harding, M.D., M.S. Interventional Cardiologist   Pager # [phone] Phone # [phone] [address] [place], [place] [address]

## 2015-12-23 NOTE — Patient Instructions (Signed)
Your physician recommends that you return for lab work fasting.  Your physician recommends that you schedule a follow-up appointment in: 6 months with a PA and 1 year with Dr Herbie BaltimoreHarding.

## 2016-01-08 ENCOUNTER — Telehealth: Payer: Self-pay | Admitting: Cardiology

## 2016-01-08 DIAGNOSIS — Z79899 Other long term (current) drug therapy: Secondary | ICD-10-CM

## 2016-01-08 DIAGNOSIS — E785 Hyperlipidemia, unspecified: Secondary | ICD-10-CM

## 2016-01-08 NOTE — Telephone Encounter (Signed)
New message ° °Pt returning [name]'s call °

## 2016-01-08 NOTE — Telephone Encounter (Signed)
Spoke to patient. Result given . Verbalized understanding  patient stated she started crestor daily last week Will mail lab slip feb-march  2018 recheck, before next extender appointment

## 2016-03-11 ENCOUNTER — Other Ambulatory Visit: Payer: Self-pay | Admitting: Cardiology

## 2016-04-05 ENCOUNTER — Telehealth: Payer: Self-pay | Admitting: *Deleted

## 2016-04-05 MED ORDER — ROSUVASTATIN CALCIUM 5 MG PO TABS: 5.0000 mg | ORAL_TABLET | Freq: Every day | ORAL | 2 refills | Status: DC

## 2016-04-05 NOTE — Telephone Encounter (Signed)
Returned call to patient. She states she was getting a 90 day supply of crestor. She states in October 2017, MD advised that she take crestor every day - this is noted on lab results (may not have been updated in patient meds). apologized for inconvenience given that patient was given refill for med w/instructions to take MWF only. Rx(s) sent to pharmacy electronically.

## 2016-04-05 NOTE — Telephone Encounter (Signed)
Patient left a msg on the refill vm requesting a call back at [phone] as she stated that rx for rosuvastatin was sent in wrong. Thanks, MI

## 2016-05-20 ENCOUNTER — Telehealth: Payer: Self-pay | Admitting: *Deleted

## 2016-05-20 DIAGNOSIS — E785 Hyperlipidemia, unspecified: Secondary | ICD-10-CM

## 2016-05-20 DIAGNOSIS — Z79899 Other long term (current) drug therapy: Secondary | ICD-10-CM

## 2016-05-20 NOTE — Telephone Encounter (Signed)
-----   Message from Tobin ChadSharon Martin V, RN sent at 01/08/2016  4:28 PM EDT ----- Lipid, hepatic level due June 07, 2016 .befroe appointment with extender Will mail in march 2018

## 2016-05-20 NOTE — Telephone Encounter (Signed)
MAILED LAB SLIP AND LETTER 

## 2016-05-26 ENCOUNTER — Other Ambulatory Visit: Payer: Self-pay | Admitting: Cardiology

## 2016-06-02 LAB — HEPATIC FUNCTION PANEL
ALBUMIN: 4.5 g/dL (ref 3.5–4.8)
ALT: 14 IU/L (ref 0–32)
AST: 13 IU/L (ref 0–40)
Alkaline Phosphatase: 69 IU/L (ref 39–117)
Bilirubin Total: 0.3 mg/dL (ref 0.0–1.2)
Bilirubin, Direct: 0.09 mg/dL (ref 0.00–0.40)
Total Protein: 6.7 g/dL (ref 6.0–8.5)

## 2016-06-02 LAB — LIPID PANEL
CHOL/HDL RATIO: 3.1 ratio (ref 0.0–4.4)
Cholesterol, Total: 178 mg/dL (ref 100–199)
HDL: 57 mg/dL (ref >39)
LDL CALC: 78 mg/dL (ref 0–99)
Triglycerides: 217 mg/dL — ABNORMAL HIGH (ref 0–149)
VLDL Cholesterol Cal: 43 mg/dL — ABNORMAL HIGH (ref 5–40)

## 2016-06-03 ENCOUNTER — Other Ambulatory Visit: Payer: Self-pay | Admitting: Cardiology

## 2016-06-10 NOTE — Telephone Encounter (Signed)
Lipid panel looks pretty good. Triglycerides are still elevated and this is probably related to carbohydrate intake. Need to also keep close eye on blood sugars. HDL is good. LDL is close to goal which is 70. This is improved in comparison to 5 months ago.  Normal liver function tests.  This would indicate that being on the Crestor daily is helpful.  Bryan Lemma, MD

## 2016-06-21 ENCOUNTER — Ambulatory Visit (INDEPENDENT_AMBULATORY_CARE_PROVIDER_SITE_OTHER): Payer: Medicare Other | Admitting: Physician Assistant

## 2016-06-21 ENCOUNTER — Encounter: Payer: Self-pay | Admitting: Physician Assistant

## 2016-06-21 VITALS — BP 187/83 | HR 82 | Ht 63.0 in | Wt 168.6 lb

## 2016-06-21 DIAGNOSIS — I25810 Atherosclerosis of coronary artery bypass graft(s) without angina pectoris: Secondary | ICD-10-CM | POA: Diagnosis not present

## 2016-06-21 DIAGNOSIS — I10 Essential (primary) hypertension: Secondary | ICD-10-CM | POA: Diagnosis not present

## 2016-06-21 DIAGNOSIS — I739 Peripheral vascular disease, unspecified: Secondary | ICD-10-CM

## 2016-06-21 DIAGNOSIS — R600 Localized edema: Secondary | ICD-10-CM

## 2016-06-21 DIAGNOSIS — I779 Disorder of arteries and arterioles, unspecified: Secondary | ICD-10-CM

## 2016-06-21 NOTE — Progress Notes (Signed)
Cardiology Office Note   Date:  06/21/2016   ID:  [name], [name] November 18, 1945, MRN [id number]  PCP:  Pamela Carney, MD  Cardiologist:  Dr. Herbie Webster 12/23/2015  Pamela Demark, PA-C   Chief Complaint  Patient presents with  . Follow-up    6 months;   . Edema    ankles; legs    History of Present Illness: [name] is a 71 y.o. female with a history of CABG 2003, SVG-D1 100% but D1 patent at cath 2008, nl EF by echo and MV 2016 w/ no ischemia, HTN, HLD, L>R LE edema, OSA on CPAP.  [name] presents for 6 month follow up.  Her BP is elevated today, she took her medication this am, but not that long ago.  In cold weather, her legs don't swell. However, now that it is getting warmer outside, her L leg will start to swell. She has varicose veins on that side. She is using Lite Salt and does not eat a lot of prepared foods.   She will go visit her mother in May, be gone for a month. She will eat out a great deal then. She notices more lower extremity edema when she is visiting her mother.  No chest pain, no SOB. She will feel dizzy occasionally. It is exertional, when she has been rushing around. She does not have palpitations. All she has to do slow down a little bit and the feeling of being lightheaded will go away. She has problems with stairs due to L knee problems. However, she can climb the stairs without stopping due to SOB. She does not exercise. She does walk the dogs every day, but not for a long period of time.   Past Medical History:  Diagnosis Date  . CAD (coronary artery disease) of bypass graft 2004/2008   Cath 2008: SVG-D1 Occluded with patent D1, LM 90%, Patent LIMA-LAD & SVG-OM1, patent Native RCA, EF ~45-50% & 2+ MR.; Myoview 2012: No ischemia or infarction, Normal EF; Echo 2013: Nl LV Fxn & normal valves   . H/O unstable angina  2003   Cardiac cath with multivessel disease referred for CABG  . Hyperlipidemia LDL goal <70   . Hypertension, benign   .  Left main coronary artery disease 2003   70% ostial left main, 100% Cx,; patent, normal RCA  . 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 Laneta Webster)     Past Surgical History:  Procedure Laterality Date  . CARDIAC CATHETERIZATION   Novembeer 2003   70% left main, 100 % Cx @ OM; normal RCA  . CARDIAC CATHETERIZATION  2004, 2008   Occluded SVG-D1 with antegrade native D1 flow; LM increased from 70-90% with patent LIMA and SVG-OM1; patent RCA ; EF of 45-50% with 2+ MR   . Carotid Duplex Ultrasound  July 2014   Less than 49% right carotid; relatively normal left carotid.  . CORONARY ARTERY BYPASS GRAFT  November 2003   Dr. Laneta Webster: LIMA-LAD, SVG-OM 1, SVG-D1  . NM MYOVIEW LTD  2012; September 2016   a. No evidence of ischemia, normal EF;; b. Hyperdynamic LV, EF 69%. Hypertensive response to exercise. ST depressions in V5 and V6. Small, moderate intensity, fixed defect in the distal anterior and apical wall consistent with soft tissue attenuation. LOW RISK.  Marland Kitchen TRANSTHORACIC ECHOCARDIOGRAM  2013   Normal LV function with normal valves.    Current Outpatient Prescriptions  Medication Sig Dispense Refill  .  aspirin 81 MG tablet Take 81 mg by mouth daily.    . cholecalciferol (VITAMIN D) 1000 UNITS tablet Take 1,000 Units by mouth daily.    . clopidogrel (PLAVIX) 75 MG tablet TAKE 1 TABLET DAILY 90 tablet 2  . Coenzyme Q10 100 MG capsule Take 3 capsules (300 mg total) by mouth daily. (Patient taking differently: Take 100 mg by mouth daily. ) 90 capsule 11  . fexofenadine (ALLEGRA) 180 MG tablet Take 180 mg by mouth daily as needed for allergies or rhinitis.    . folic acid (FOLVITE) 400 MCG tablet Take 400 mcg by mouth daily.    . furosemide (LASIX) 20 MG tablet Take 1 tablet by mouth daily as needed 90 tablet 3  . irbesartan-hydrochlorothiazide (AVALIDE) 300-12.5 MG per tablet Take 1 tablet by mouth daily.     . Magnesium 250 MG TABS Take 1 tablet by mouth daily.    . metoprolol  succinate (TOPROL-XL) 100 MG 24 hr tablet Take 1 tablet (100 mg total) by mouth daily. Take with or immediately following a meal. 90 tablet 3  . Multiple Vitamin (MULTIVITAMIN) capsule Take 1 capsule by mouth daily.    . Omega-3 Fatty Acids (FISH OIL) 1200 MG CAPS Take 2 capsules by mouth daily.    . potassium chloride SA (K-DUR,KLOR-CON) 20 MEQ tablet TAKE 1 TABLET DAILY 90 tablet 2  . rosuvastatin (CRESTOR) 5 MG tablet Take 1 tablet (5 mg total) by mouth daily. 90 tablet 2   No current facility-administered medications for this visit.     Allergies:   Etodolac; Motrin [ibuprofen]; Statins; and Carvedilol    Social History:  The patient  reports that she has never smoked. She has never used smokeless tobacco. She reports that she does not drink alcohol or use drugs.   Family History:  The patient's family history is not on file.    ROS:  Please see the history of present illness. All other systems are reviewed and negative.    PHYSICAL EXAM: VS:  BP (!) 187/83   Pulse 82   Ht  (1.6 m)   Wt 168 lb 9.6 oz (76.5 kg)   BMI 29.87 kg/m  , BMI Body mass index is 29.87 kg/m. GEN: Well nourished, well developed, female in no acute distress  HEENT: normal for age  Neck: no JVD, Soft right carotid bruit, no masses Cardiac: RRR; Soft murmur, no rubs, or gallops Respiratory:  clear to auscultation bilaterally, normal work of breathing GI: soft, nontender, nondistended, + BS MS: no deformity or atrophy; 1+ LLE edema; distal pulses are 2+ in all 4 extremities   Skin: warm and dry, no rash Neuro:  Strength and sensation are intact Psych: euthymic mood, full affect   EKG:  EKG is not ordered today.  Recent Labs: 12/23/2015: BUN 18; Creat 0.77; Potassium 3.9; Sodium 141 06/01/2016: ALT 14    Lipid Panel    Component Value Date/Time   CHOL 178 06/01/2016 0854   TRIG 217 (H) 06/01/2016 0854   HDL 57 06/01/2016 0854   CHOLHDL 3.1 06/01/2016 0854   CHOLHDL 3.7 12/23/2015 0849    VLDL 43 (H) 12/23/2015 0849   LDLCALC 78 06/01/2016 0854     Wt Readings from Last 3 Encounters:  06/21/16 168 lb 9.6 oz (76.5 kg)  12/23/15 165 lb (74.8 kg)  06/05/15 166 lb 12.8 oz (75.7 kg)     Other studies Reviewed: Additional studies/ records that were reviewed today include: Office notes from hospital  records and testing.  ASSESSMENT AND PLAN:  1.  CAD: She is having no ischemic symptoms. She is to continue current therapy. She has some daytime edema in her left leg, but does not wake with. She has no other signs or symptoms of volume overload. Continue aspirin, Plavix, beta blocker, Ace. We discussed ambulation and she is strongly encouraged to walk more consistently. If she can get 3 episodes of walking 20 minutes each, that is a total of 30 minutes a day and that is acceptable. She is encouraged to try to do this.  2. Hypertension: Her blood pressure is up today, but she has not yet had her morning medications. She assures me she will go intake and now. She did not have a chance to eat today and that is why she has not taken yet. Her blood pressure at home does not run high she feels it is under good control.  3. Lower extremity edema: She does not wake with this. She gets it during the day. Compression stockings help. It is much greater on the left than on the right, her mother has the same problem. She has been checked for DVT before and it was negative. She is encouraged to continue to wear compression socks. She rarely takes the when necessary Lasix.  4. Carotid artery disease: She has a soft right bruit. Her father required surgery. Her last Dopplers were 4 years ago. We will recheck those and she is to keep scheduled follow-up with Dr. Herbie Webster. The disease in the arteries in her neck has progressed significantly, we will call and get her in sooner.   Current medicines are reviewed at length with the patient today.  The patient does not have concerns regarding medicines.  The  following changes have been made:  no change  Labs/ tests ordered today include:  No orders of the defined types were placed in this encounter.    Disposition:   FU with Dr. Herbie Webster  Signed, Dessie Delcarlo, Deneen Harts  06/21/2016 8:52 AM    [facility] Phone: [phone]; Fax: [phone]  This note was written with the assistance of speech recognition software. Please excuse any transcriptional errors.

## 2016-06-21 NOTE — Patient Instructions (Addendum)
Medication Instructions:  Your physician recommends that you continue on your current medications as directed. Please refer to the Current Medication list given to you today.  If you need a refill on your cardiac medications before your next appointment, please call your pharmacy.  Labwork: NON  Testing/Procedures: Your physician has requested that you have a carotid doppler. This test is an ultrasound of the carotid arteries in your neck. It looks at blood flow through these arteries that supply the brain with blood. Allow one hour for this exam. There are no restrictions or special instructions.  Follow-Up: Your physician wants you to follow-up in: October 2018 You will receive a reminder letter in the mail two months in advance. If you don't receive a letter, please call our office July 2018 to schedule the October 2018 follow-up appointment.   Special Instructions: TAKE BP THREE TIMES WEEKLY  INCREASE WALKING TO 30 MIN DAILY OK TO SPLIT TO THREE TIME DAILY AT LEAST 10 MINUTES  WEAR COMPRESSION STOCKING MORE OFTEN  ELEVATE LEGS WHEN SITTING   Thank you for choosing [facility] at [facility]!!    RHONDA BARRETT, Franchot Gallo, LPN

## 2016-06-30 ENCOUNTER — Other Ambulatory Visit: Payer: Self-pay | Admitting: Cardiology

## 2016-06-30 MED ORDER — METOPROLOL SUCCINATE ER 100 MG PO TB24: 100.0000 mg | ORAL_TABLET | Freq: Every day | ORAL | 3 refills | Status: DC

## 2016-06-30 NOTE — Telephone Encounter (Signed)
°*  STAT* If patient is at the pharmacy, call can be transferred to refill team.   1. Which medications need to be refilled? (please list name of each medication and dose if known) Metoprolol  2. Which pharmacy/location (including street and city if local pharmacy) is medication to be sent to?[facility]-  3. Do they need a 30 day or 90 day supply? 90 and refills

## 2016-08-16 ENCOUNTER — Ambulatory Visit (HOSPITAL_COMMUNITY)
Admission: RE | Admit: 2016-08-16 | Discharge: 2016-08-16 | Disposition: A | Discharge status: Home / Self Care | Payer: Medicare Other | Source: Ambulatory Visit | Attending: Cardiology | Admitting: Cardiology

## 2016-08-16 DIAGNOSIS — I779 Disorder of arteries and arterioles, unspecified: Secondary | ICD-10-CM | POA: Diagnosis present

## 2016-08-16 DIAGNOSIS — I6523 Occlusion and stenosis of bilateral carotid arteries: Secondary | ICD-10-CM | POA: Insufficient documentation

## 2016-08-16 DIAGNOSIS — E785 Hyperlipidemia, unspecified: Secondary | ICD-10-CM | POA: Insufficient documentation

## 2016-08-16 DIAGNOSIS — I2510 Atherosclerotic heart disease of native coronary artery without angina pectoris: Secondary | ICD-10-CM | POA: Insufficient documentation

## 2016-08-16 DIAGNOSIS — I739 Peripheral vascular disease, unspecified: Secondary | ICD-10-CM

## 2016-08-16 DIAGNOSIS — I10 Essential (primary) hypertension: Secondary | ICD-10-CM | POA: Insufficient documentation

## 2016-08-16 DIAGNOSIS — Z951 Presence of aortocoronary bypass graft: Secondary | ICD-10-CM | POA: Insufficient documentation

## 2016-08-25 NOTE — Progress Notes (Signed)
Moderate carotid disease on the right (progressed from previous level, now 40-59%), mild disease on the left (<40%). No treatment needed. Repeat image in 1 year

## 2016-08-26 ENCOUNTER — Other Ambulatory Visit: Payer: Self-pay | Admitting: Family Medicine

## 2016-08-26 DIAGNOSIS — Z1231 Encounter for screening mammogram for malignant neoplasm of breast: Secondary | ICD-10-CM

## 2016-10-07 ENCOUNTER — Telehealth: Payer: Self-pay | Admitting: Cardiology

## 2016-10-07 NOTE — Telephone Encounter (Signed)
Approval routed electronically to requesting provider at listed fax.

## 2016-10-07 NOTE — Telephone Encounter (Signed)
New message          [facility] Pre-operative Risk Assessment    Request for surgical clearance:  1. What type of surgery is being performed?  colonoscopy  2. When is this surgery scheduled?   10-19-16  3. Are there any medications that need to be held prior to surgery and how long? Hold plavix prior to procedure  4. Name of physician performing surgery?  Dr Madilyn FiremanHayes  What is your office phone and fax number? [phone] fax Cecil CrankerDonna M Price 10/07/2016, 11:15 AM  _________________________________________________________________   (provider comments below)

## 2016-10-07 NOTE — Telephone Encounter (Signed)
Okay to stop Plavix prior to procedure 5-7 days.  Bryan Lemmaavid Ariellah Faust, MD

## 2016-10-11 ENCOUNTER — Ambulatory Visit
Admission: RE | Admit: 2016-10-11 | Discharge: 2016-10-11 | Disposition: A | Discharge status: Home / Self Care | Payer: Medicare Other | Source: Ambulatory Visit | Attending: Family Medicine | Admitting: Family Medicine

## 2016-10-11 DIAGNOSIS — Z1231 Encounter for screening mammogram for malignant neoplasm of breast: Secondary | ICD-10-CM

## 2016-12-22 ENCOUNTER — Telehealth: Payer: Self-pay | Admitting: Cardiology

## 2016-12-22 ENCOUNTER — Encounter: Payer: Self-pay | Admitting: Cardiology

## 2016-12-22 ENCOUNTER — Ambulatory Visit (INDEPENDENT_AMBULATORY_CARE_PROVIDER_SITE_OTHER): Payer: Medicare Other | Admitting: Cardiology

## 2016-12-22 VITALS — BP 162/70 | HR 69 | Ht 63.0 in | Wt 157.4 lb

## 2016-12-22 DIAGNOSIS — I25810 Atherosclerosis of coronary artery bypass graft(s) without angina pectoris: Secondary | ICD-10-CM | POA: Diagnosis not present

## 2016-12-22 DIAGNOSIS — I10 Essential (primary) hypertension: Secondary | ICD-10-CM | POA: Diagnosis not present

## 2016-12-22 DIAGNOSIS — E785 Hyperlipidemia, unspecified: Secondary | ICD-10-CM

## 2016-12-22 DIAGNOSIS — Z8679 Personal history of other diseases of the circulatory system: Secondary | ICD-10-CM

## 2016-12-22 DIAGNOSIS — Z79899 Other long term (current) drug therapy: Secondary | ICD-10-CM

## 2016-12-22 DIAGNOSIS — I2510 Atherosclerotic heart disease of native coronary artery without angina pectoris: Secondary | ICD-10-CM

## 2016-12-22 DIAGNOSIS — R600 Localized edema: Secondary | ICD-10-CM

## 2016-12-22 LAB — COMPREHENSIVE METABOLIC PANEL
A/G RATIO: 1.8 (ref 1.2–2.2)
ALT: 13 IU/L (ref 0–32)
AST: 16 IU/L (ref 0–40)
Albumin: 4.4 g/dL (ref 3.5–4.8)
Alkaline Phosphatase: 62 IU/L (ref 39–117)
BUN/Creatinine Ratio: 25 (ref 12–28)
BUN: 18 mg/dL (ref 8–27)
Bilirubin Total: 0.5 mg/dL (ref 0.0–1.2)
CALCIUM: 9.4 mg/dL (ref 8.7–10.3)
CO2: 26 mmol/L (ref 20–29)
CREATININE: 0.72 mg/dL (ref 0.57–1.00)
Chloride: 103 mmol/L (ref 96–106)
GFR, EST AFRICAN AMERICAN: 97 mL/min/{1.73_m2} (ref >59)
GFR, EST NON AFRICAN AMERICAN: 85 mL/min/{1.73_m2} (ref >59)
Globulin, Total: 2.5 g/dL (ref 1.5–4.5)
Glucose: 94 mg/dL (ref 65–99)
POTASSIUM: 4.9 mmol/L (ref 3.5–5.2)
Sodium: 143 mmol/L (ref 134–144)
TOTAL PROTEIN: 6.9 g/dL (ref 6.0–8.5)

## 2016-12-22 LAB — LIPID PANEL
CHOL/HDL RATIO: 3 ratio (ref 0.0–4.4)
Cholesterol, Total: 149 mg/dL (ref 100–199)
HDL: 49 mg/dL (ref >39)
LDL CALC: 75 mg/dL (ref 0–99)
TRIGLYCERIDES: 123 mg/dL (ref 0–149)
VLDL CHOLESTEROL CAL: 25 mg/dL (ref 5–40)

## 2016-12-22 MED ORDER — ROSUVASTATIN CALCIUM 10 MG PO TABS: 10.0000 mg | ORAL_TABLET | ORAL | 3 refills | Status: DC

## 2016-12-22 MED ORDER — AMLODIPINE BESYLATE 2.5 MG PO TABS: 2.5000 mg | ORAL_TABLET | Freq: Every day | ORAL | 3 refills | Status: DC

## 2016-12-22 MED ORDER — FELODIPINE ER 2.5 MG PO TB24: 2.5000 mg | ORAL_TABLET | Freq: Every day | ORAL | 3 refills | Status: DC

## 2016-12-22 MED ORDER — FISH OIL 1200 MG PO CAPS: 3.0000 | ORAL_CAPSULE | Freq: Every day | ORAL | 11 refills | Status: AC

## 2016-12-22 NOTE — Progress Notes (Signed)
PCP: Clovis Riley, L.August Saucer, MD  Clinic Note: Chief Complaint  Patient presents with  . Annual Exam    pt reports increased myalgias with statins - currently taking 1 tab QD, has felt relief when taking 3x weekly  . Coronary Artery Disease  . Hyperlipidemia    somewhat statin intolerant    HPI: [name] is a 71 y.o. female with a PMH below who presents today for 69month f/u CAD-CABG. I had her follow-up with our pharmacy run hypertension clinic over the past year but has not been seen there since. She has a distant history of left main disease with CABG back in 2000 for the most recent invasive evaluation 2008. Last Myoview was in 2016. Showed normal EF and no ischemia, infarct.  SHILO PHILIPSON was last seen in April 2018 by Theodore Demark - recommendation was to increase walking a compression stockings, elevate legs.  Recent Hospitalizations: none  Studies Reviewed:   Carotid Dopplers June 2018: Moderate right carotid disease (40 and 59%), mild left less than 40%.  Interval History: [name] presents today doing relatively well with no major complaints besides continued left leg swelling. She doesn't like to whether compression stockings during the summertime, but does weatherman when her. She sleeps well using her CPAP and really denies any PND or orthopnea. She denies any resting or exertional chest tightness/pressure or dyspnea. She states that going up stairs or an incline for instance would bother her knees more than her breathing.  No rapid irregular heartbeats or palpitations. No lightheadedness, dizziness or sick be/near syncope. No TIA or amaurosis fugax symptoms.  She had weaned her statin dose down to 1 3 days a week as opposed to daily for myalgias, and said that that really made a difference and going back up to daily exacerbated symptoms. She is back down 3 days a week, but is hoping to titrate up to 10 mg 3 days a week..  She really denies any cramping except for the left  ankle after swelling -- she rarely uses when necessary Lasix.Marland Kitchen No claudication.  ROS: A comprehensive was performed. Review of Systems  Constitutional: Negative for malaise/fatigue.  HENT: Negative for nosebleeds.   Respiratory: Negative for cough and shortness of breath.   Cardiovascular: Positive for leg swelling (left leg>>>> right). Negative for claudication.  Gastrointestinal: Negative for heartburn.  Musculoskeletal: Positive for joint pain (Left knee). Negative for falls and myalgias (Cramps are much better).  Neurological: Negative for dizziness, focal weakness, loss of consciousness, weakness and headaches.  Endo/Heme/Allergies: Does not bruise/bleed easily.  Psychiatric/Behavioral: Negative for depression and memory loss. The patient is not nervous/anxious and does not have insomnia.   All other systems reviewed and are negative.   Past Medical History:  Diagnosis Date  . CAD (coronary artery disease) of bypass graft 2004/2008   Cath 2008: SVG-D1 Occluded with patent D1, LM 90%, Patent LIMA-LAD & SVG-OM1, patent Native RCA, EF ~45-50% & 2+ MR.; Myoview 2012: No ischemia or infarction, Normal EF; Echo 2013: Nl LV Fxn & normal valves   . H/O unstable angina  2003   Cardiac cath with multivessel disease referred for CABG  . Hyperlipidemia LDL goal <70   . Hypertension, benign   . Left main coronary artery disease 2003   70% ostial left main, 100% Cx,; patent, normal RCA  . 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 Laneta Simmers)     Past Surgical History:  Procedure Laterality Date  . CARDIAC CATHETERIZATION  Novembeer 2003   70% left main, 100 % Cx @ OM; normal RCA  . CARDIAC CATHETERIZATION  2004, 2008   Occluded SVG-D1 with antegrade native D1 flow; LM increased from 70-90% with patent LIMA and SVG-OM1; patent RCA ; EF of 45-50% with 2+ MR   . Carotid Duplex Ultrasound  July 2014   Less than 49% right carotid; relatively normal left carotid.  . CORONARY ARTERY  BYPASS GRAFT  November 2003   Dr. Laneta Simmers: LIMA-LAD, SVG-OM 1, SVG-D1  . NM MYOVIEW LTD  2012; September 2016   a. No evidence of ischemia, normal EF;; b. Hyperdynamic LV, EF 69%. Hypertensive response to exercise. ST depressions in V5 and V6. Small, moderate intensity, fixed defect in the distal anterior and apical wall consistent with soft tissue attenuation. LOW RISK.  Marland Kitchen TRANSTHORACIC ECHOCARDIOGRAM  2013   Normal LV function with normal valves.    Current Meds  Medication Sig  . aspirin 81 MG tablet Take 81 mg by mouth daily.  . cholecalciferol (VITAMIN D) 1000 UNITS tablet Take 1,000 Units by mouth daily.  . clopidogrel (PLAVIX) 75 MG tablet TAKE 1 TABLET DAILY  . Coenzyme Q10-Vitamin E (QUNOL ULTRA COQ10 PO) Take 1 capsule by mouth daily.  . fexofenadine (ALLEGRA) 180 MG tablet Take 180 mg by mouth daily as needed for allergies or rhinitis.  . folic acid (FOLVITE) 400 MCG tablet Take 400 mcg by mouth daily.  . furosemide (LASIX) 20 MG tablet Take 1 tablet by mouth daily as needed  . irbesartan-hydrochlorothiazide (AVALIDE) 300-12.5 MG per tablet Take 1 tablet by mouth daily.   . Magnesium 250 MG TABS Take 1 tablet by mouth daily.  . metoprolol succinate (TOPROL-XL) 100 MG 24 hr tablet Take 1 tablet (100 mg total) by mouth daily. Take with or immediately following a meal.  . Multiple Vitamin (MULTIVITAMIN) capsule Take 1 capsule by mouth daily.  . Omega-3 Fatty Acids (FISH OIL) 1200 MG CAPS Take 3 capsules (3,600 mg total) by mouth daily.  . potassium chloride SA (K-DUR,KLOR-CON) 20 MEQ tablet TAKE 1 TABLET DAILY  . [DISCONTINUED] Omega-3 Fatty Acids (FISH OIL) 1200 MG CAPS Take 2 capsules by mouth daily.  . [DISCONTINUED] rosuvastatin (CRESTOR) 5 MG tablet Take 1 tablet (5 mg total) by mouth daily.    Allergies  Allergen Reactions  . Etodolac Anaphylaxis    migraines  . Amlodipine     PATIENT NOT SURE THINKS LEG SWELLING. USE IN THE 1990'S  . Motrin [Ibuprofen] Swelling     Throat swells  . Statins     Myalgias   . Carvedilol Rash    Social History   Social History  . Marital status: Married    Spouse name: N/A  . Number of children: N/A  . Years of education: N/A   Social History Main Topics  . Smoking status: Never Smoker  . Smokeless tobacco: Never Used  . Alcohol use No  . Drug use: No  . Sexual activity: Not Asked   Other Topics Concern  . None   Social History Narrative   71 year old married white woman, mother of 3, grandmother of 4.   Never smoked. Rare social alcohol.   She is a Hotel manager around the house, and does lots of the chart work.   Her husband is also a 4 patient Dr. Alanda Amass, and now follows up with Dr. Herbie Baltimore.   Family History  Problem Relation Age of Onset  . Breast cancer Neg Hx     Wt  Readings from Last 3 Encounters:  12/22/16 157 lb 6.4 oz (71.4 kg)  06/21/16 168 lb 9.6 oz (76.5 kg)  12/23/15 165 lb (74.8 kg)    PHYSICAL EXAM BP (!) 162/70   Pulse 69   Ht 5\' 3"  (1.6 m)   Wt 157 lb 6.4 oz (71.4 kg)   BMI 27.88 kg/m   Physical Exam  Constitutional: She is oriented to person, place, and time. She appears well-developed and well-nourished. No distress.  Well-groomed. Pleasant mood and affect  HENT:  Head: Normocephalic and atraumatic.  Mouth/Throat: Oropharynx is clear and moist. No oropharyngeal exudate.  Eyes: Conjunctivae and EOM are normal. No scleral icterus.  Neck: Neck supple. No hepatojugular reflux and no JVD present. Carotid bruit is not present.  Cardiovascular: Normal rate, regular rhythm, S1 normal, S2 normal and normal pulses.   No extrasystoles are present. PMI is not displaced.  Exam reveals no gallop.   Murmur heard.  Decrescendo holosystolic murmur is present with a grade of 1/6  at the lower left sternal border, apex Pulmonary/Chest: Effort normal and breath sounds normal. No respiratory distress. She has no wheezes. She has no rales.  Abdominal: Soft. Bowel sounds are normal. She  exhibits no distension. There is no tenderness. There is no rebound.  Musculoskeletal: Normal range of motion. She exhibits edema (1-2+ left leg,ith trace right leg edema with mild varicose veins. No significant venous stasis dermatitis noted.).  Neurological: She is alert and oriented to person, place, and time. She exhibits normal muscle tone.  Skin: Skin is warm and dry. No rash noted. No erythema.  Psychiatric: She has a normal mood and affect. Her behavior is normal. Judgment and thought content normal.  Nursing note and vitals reviewed.   Adult ECG Report Normal sinus rhythm, rate 71 BPM. Normal axis, intervals and durations. Stable EKG.  Other studies Reviewed: Additional studies/ records that were reviewed today include:  Recent Labs:   Lab Results  Component Value Date   CHOL 178 06/01/2016   HDL 57 06/01/2016   LDLCALC 78 06/01/2016   TRIG 217 (H) 06/01/2016   CHOLHDL 3.1 06/01/2016    ASSESSMENT / PLAN: Problem List Items Addressed This Visit    Bilateral lower extremity edema - mild, likely venous stasis (Chronic)    Left greater than right edema.  Not really requiring additional doses of Lasix. Recommendation would be to continue wearing support stockings and elevate feet. Also recommend continued ambulation.      Relevant Orders   EKG 12-Lead   Comprehensive metabolic panel   Coronary artery disease involving coronary bypass graft - occluded SVG-D1 - Primary (Chronic)    She continues to do very well without any active anginal symptoms. She is active and denies any chest tightness / pressure or dyspnea. Last Myoview was in September 2016 showed a small distal anterior defect that may very well be related to her known occluded SVG-D1. In the absence of anginal symptoms, would not pursue this any further. Plan: Continue with current dose of beta blocker and ARB.   Continue to adjust statin dose according to tolerance.  Okay to stop aspirin and continue Plavix.        Relevant Medications   rosuvastatin (CRESTOR) 10 MG tablet   felodipine (PLENDIL) 2.5 MG 24 hr tablet   Other Relevant Orders   EKG 12-Lead   Lipid panel   Comprehensive metabolic panel   Lipid panel   Hepatic function panel   Essential hypertension (Chronic)  Not adequately controlled. She is on irbesartan metoprolol succinate (having not tolerated higher dose of carvedilol).  Plan for now will be to re-attempt a different dietary reading calcium channel blocker;Felodipine 2.5 mg daily      Relevant Medications   rosuvastatin (CRESTOR) 10 MG tablet   felodipine (PLENDIL) 2.5 MG 24 hr tablet   Other Relevant Orders   EKG 12-Lead   H/O unstable angina (Chronic)    No recurrent anginal symptoms on current regimen. No further requirement for nitroglycerin. Stable on current dose of beta blocker A negative Myoview 2 years ago      Hyperlipidemia LDL goal <70 (Chronic)    Not at goal on the way based on her most recent labs back in March. We will recheck labs now I suspect that there may not be as good control since she is backed off on her statin dosing interval. Plan from [place] to check her lipids today and proceed with the plan to titrate up to 10 mg 3 times a week rosuvastatin. I've asked that she increase her Fish oh to 3 tabs a day and try to increase her CoQ10 to total 300 mg day.  We will recheck lipids in 3 months, low threshold to consider Zetia and then potential referral for consideration of PCSK9 inhibitor - she is in the classic range for the PCSK9 inhibitor trials..      Relevant Medications   rosuvastatin (CRESTOR) 10 MG tablet   felodipine (PLENDIL) 2.5 MG 24 hr tablet   Other Relevant Orders   EKG 12-Lead   Lipid panel   Comprehensive metabolic panel   Lipid panel   Hepatic function panel   Left main coronary artery disease - 90% (Chronic)    Status post CABG. No active anginal symptoms. Continue risk factor modification.      Relevant Medications    rosuvastatin (CRESTOR) 10 MG tablet   felodipine (PLENDIL) 2.5 MG 24 hr tablet   Medication management   Relevant Orders   Lipid panel   Comprehensive metabolic panel   Lipid panel   Hepatic function panel     Flu shot given today  Current medicines are reviewed at length with the patient today. (+/- concerns) cramps with Crestor The following changes have been made: take M-W-F & titrate to 10 mg dose  Patient Instructions  MEDICATION CHANGES  INCREASE FISH OIL CAPSULES TO 3 CAPSULES DAILY.  INCREASE ROSUVASTATIN 10 MG TABLET  TO 3 TIMES A WEEK.  START FELODIPINE 2.5 MG ONE TABLET DAILY.     LABS TODAY  LIPID, CMP  ALSO IN 3 MONTHS LABS -- LIPID , HEPATIC PANEL WILL MAIL LAB SLIP AND INSTRUCTION AT THAT TIME     Your physician recommends that you schedule a follow-up appointment in 3 MONTHS WITH RHONDA    Orders Placed This Encounter  Procedures  . Lipid panel  . Comprehensive metabolic panel  . Lipid panel  . Hepatic function panel  . EKG 12-Lead     Bryan Lemmaavid Harding, M.D., M.S. Interventional Cardiologist   Pager # [phone] Phone # [phone] [address] [place], [place] [address]

## 2016-12-22 NOTE — Telephone Encounter (Signed)
New message    Pt is calling [facility] that were called in this morning at her appt. Please call.

## 2016-12-22 NOTE — Assessment & Plan Note (Signed)
Not at goal on the way based on her most recent labs back in March. We will recheck labs now I suspect that there may not be as good control since she is backed off on her statin dosing interval. Plan from [place] to check her lipids today and proceed with the plan to titrate up to 10 mg 3 times a week rosuvastatin. I've asked that she increase her Fish oh to 3 tabs a day and try to increase her CoQ10 to total 300 mg day.  We will recheck lipids in 3 months, low threshold to consider Zetia and then potential referral for consideration of PCSK9 inhibitor - she is in the classic range for the PCSK9 inhibitor trials..Marland Kitchen

## 2016-12-22 NOTE — Telephone Encounter (Signed)
Spoke with pt, she reports [facility] and that was wrong. It was supposed to be felodipine. The felodipine was called to [facility] and she will be able to get that tomorrow but she needs the amlodipine cx, she can not take that. [facility] with a note to disregard the amlodipine script.

## 2016-12-22 NOTE — Assessment & Plan Note (Signed)
Not adequately controlled. She is on irbesartan metoprolol succinate (having not tolerated higher dose of carvedilol).  Plan for now will be to re-attempt a different dietary reading calcium channel blocker;Felodipine 2.5 mg daily

## 2016-12-22 NOTE — Assessment & Plan Note (Signed)
Status post CABG. No active anginal symptoms. Continue risk factor modification.

## 2016-12-22 NOTE — Assessment & Plan Note (Signed)
No recurrent anginal symptoms on current regimen. No further requirement for nitroglycerin. Stable on current dose of beta blocker A negative Myoview 2 years ago

## 2016-12-22 NOTE — Assessment & Plan Note (Signed)
Left greater than right edema.  Not really requiring additional doses of Lasix. Recommendation would be to continue wearing support stockings and elevate feet. Also recommend continued ambulation.

## 2016-12-22 NOTE — Patient Instructions (Addendum)
MEDICATION CHANGES  INCREASE FISH OIL CAPSULES TO 3 CAPSULES DAILY.  INCREASE ROSUVASTATIN 10 MG TABLET  TO 3 TIMES A WEEK.  START FELODIPINE 2.5 MG ONE TABLET DAILY.     LABS TODAY  LIPID, CMP  ALSO IN 3 MONTHS LABS -- LIPID , HEPATIC PANEL WILL MAIL LAB SLIP AND INSTRUCTION AT THAT TIME     Your physician recommends that you schedule a follow-up appointment in 3 MONTHS WITH RHONDA

## 2016-12-22 NOTE — Assessment & Plan Note (Signed)
She continues to do very well without any active anginal symptoms. She is active and denies any chest tightness / pressure or dyspnea. Last Myoview was in September 2016 showed a small distal anterior defect that may very well be related to her known occluded SVG-D1. In the absence of anginal symptoms, would not pursue this any further. Plan: Continue with current dose of beta blocker and ARB.   Continue to adjust statin dose according to tolerance.  Okay to stop aspirin and continue Plavix.

## 2016-12-30 ENCOUNTER — Telehealth: Payer: Self-pay | Admitting: Cardiology

## 2016-12-30 NOTE — Telephone Encounter (Signed)
New Message  Pt c/o medication issue:  1. Name of Medication: Amlodipine   2. How are you currently taking this medication (dosage and times per day)?   3. Are you having a reaction (difficulty breathing--STAT)? no  4. What is your medication issue? Per pt states the wrong prescription was sent to the pharmacy and she paid for the medication .please call back to discuss

## 2016-12-30 NOTE — Telephone Encounter (Signed)
Spoke wit pt she states that she was to start felodipine and [facility] . Pt sates that Dr Herbie BaltimoreHarding knows that in the past she has had bad reactions all the way back to the 90's she thinks so she states that she does not know why this was filled. She states that she has paid the co-pay kfor this and they told her since we sent in an rx for it she cannot get a refund, can we help her with this?

## 2017-01-04 NOTE — Telephone Encounter (Signed)
SPOKE TO PATIENT , INFORMED HER THAT MEDICATION WAS DISCONTINUE . PATIENT STATES SHE HAS OTHER OPTIONS FOR MEDICATIONS.

## 2017-02-14 ENCOUNTER — Other Ambulatory Visit: Payer: Self-pay | Admitting: Cardiology

## 2017-02-28 ENCOUNTER — Other Ambulatory Visit: Payer: Self-pay | Admitting: Cardiology

## 2017-02-28 NOTE — Telephone Encounter (Signed)
REFILL 

## 2017-03-28 ENCOUNTER — Ambulatory Visit: Payer: Medicare Other | Admitting: Physician Assistant

## 2017-04-18 ENCOUNTER — Encounter: Payer: Self-pay | Admitting: Physician Assistant

## 2017-04-18 ENCOUNTER — Ambulatory Visit (INDEPENDENT_AMBULATORY_CARE_PROVIDER_SITE_OTHER): Payer: Medicare Other | Admitting: Physician Assistant

## 2017-04-18 VITALS — BP 146/78 | HR 72 | Ht 63.5 in | Wt 160.0 lb

## 2017-04-18 DIAGNOSIS — I679 Cerebrovascular disease, unspecified: Secondary | ICD-10-CM | POA: Diagnosis not present

## 2017-04-18 DIAGNOSIS — I10 Essential (primary) hypertension: Secondary | ICD-10-CM

## 2017-04-18 DIAGNOSIS — E785 Hyperlipidemia, unspecified: Secondary | ICD-10-CM

## 2017-04-18 DIAGNOSIS — I25810 Atherosclerosis of coronary artery bypass graft(s) without angina pectoris: Secondary | ICD-10-CM

## 2017-04-18 MED ORDER — FELODIPINE ER 5 MG PO TB24: 5.0000 mg | ORAL_TABLET | Freq: Every day | ORAL | 3 refills | Status: DC

## 2017-04-18 NOTE — Progress Notes (Signed)
Cardiology Office Note   Date:  04/18/2017   ID:  [name], DOB 1946-01-18, MRN [id number]  PCP:  Asencion Gowda.August Saucer, MD  Cardiologist: Dr. Herbie Baltimore, 12/22/2016 Theodore Demark, PA-C   Chief Complaint  Patient presents with  . Follow-up    Blood pressure     History of Present Illness:  [name] is a 72 y.o. female with a history of CABG 2000 w/ LIMA-LAD, SVG-OM1, SVG-D1. Cath 2008 w/ occluded SVG-D1, MV 2016 w/ distal ant defect>>med rx. Hx HTN (intol amlodipine and higher dose Coreg), HLD (problems tolerating statins), OSA on CPAP (2017), carotid dz LICA 0-39%, R-ICA 40-59%.  10/17 office visit, rosuvastatin increased to 10 mg 3 times a week, fish oil increase to 3 tabs a day and co-Q10 increased to 300 mg daily, felodipine 2.5 mg daily started (amlodipine accidentally sent into the pharmacy but this was corrected).  [name] presents for cardiology follow up.  She is taking the Crestor 3 x week and doing ok with that. She is only able to afford 100 mg qd CoQ10. She hates the fish oil 3 capsules daily, gets a bad taste in her mouth afterward.   She never gets chest pain. For the last month, she has been struggling to get over a bad chest cold. 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   She walks for exercise, about 10-15"/day. She does less in cold weather, it makes her stiff.  No DOE, no orthopnea, no PND. She is compliant w/ CPAP, which is at a low setting.   Occasional palpitations, no sx associated. She has had these for years. They resolve w/ rest in a couple of minutes.   BP today is 146/78, that is low for her. She checks BP at home, has not taken it recently due to illness.    Past Medical History:  Diagnosis Date  . CAD (coronary artery disease) of bypass graft 2004/2008   Cath 2008: SVG-D1 Occluded with patent D1, LM 90%, Patent LIMA-LAD & SVG-OM1, patent Native RCA, EF ~45-50% & 2+ MR.; Myoview 2012: No ischemia  or infarction, Normal EF; Echo 2013: Nl LV Fxn & normal valves   . H/O unstable angina  2003   Cardiac cath with multivessel disease referred for CABG  . Hyperlipidemia LDL goal <70   . Hypertension, benign   . Left main coronary artery disease 2003   70% ostial left main, 100% Cx,; patent, normal RCA  . 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 Laneta Simmers)     Past Surgical History:  Procedure Laterality Date  . CARDIAC CATHETERIZATION   Novembeer 2003   70% left main, 100 % Cx @ OM; normal RCA  . CARDIAC CATHETERIZATION  2004, 2008   Occluded SVG-D1 with antegrade native D1 flow; LM increased from 70-90% with patent LIMA and SVG-OM1; patent RCA ; EF of 45-50% with 2+ MR   . Carotid Duplex Ultrasound  July 2014   Less than 49% right carotid; relatively normal left carotid.  . CORONARY ARTERY BYPASS GRAFT  November 2003   Dr. Laneta Simmers: LIMA-LAD, SVG-OM 1, SVG-D1  . NM MYOVIEW LTD  2012; September 2016   a. No evidence of ischemia, normal EF;; b. Hyperdynamic LV, EF 69%. Hypertensive response to exercise. ST depressions in V5 and V6. Small, moderate intensity, fixed defect in the distal anterior and apical wall consistent with soft tissue attenuation. LOW RISK.  Marland Kitchen TRANSTHORACIC ECHOCARDIOGRAM  2013   Normal LV function with normal valves.    Current Outpatient Medications  Medication Sig Dispense Refill  . cholecalciferol (VITAMIN D) 1000 UNITS tablet Take 1,000 Units by mouth daily.    . clopidogrel (PLAVIX) 75 MG tablet TAKE 1 TABLET DAILY 90 tablet 0  . Coenzyme Q10-Vitamin E (QUNOL ULTRA COQ10 PO) Take 1 capsule by mouth daily.    . felodipine (PLENDIL) 2.5 MG 24 hr tablet Take 1 tablet (2.5 mg total) by mouth daily. 90 tablet 3  . fexofenadine (ALLEGRA) 180 MG tablet Take 180 mg by mouth daily as needed for allergies or rhinitis.    . folic acid (FOLVITE) 400 MCG tablet Take 800 mcg by mouth daily.     . furosemide (LASIX) 20 MG tablet Take 1 tablet by mouth daily as  needed 90 tablet 3  . irbesartan-hydrochlorothiazide (AVALIDE) 300-12.5 MG per tablet Take 1 tablet by mouth daily.     . Magnesium 250 MG TABS Take 1 tablet by mouth daily.    . metoprolol succinate (TOPROL-XL) 100 MG 24 hr tablet Take 1 tablet (100 mg total) by mouth daily. Take with or immediately following a meal. 90 tablet 3  . Multiple Vitamin (MULTIVITAMIN) capsule Take 1 capsule by mouth daily.    . Omega-3 Fatty Acids (FISH OIL) 1200 MG CAPS Take 3 capsules (3,600 mg total) by mouth daily. 90 capsule 11  . potassium chloride SA (K-DUR,KLOR-CON) 20 MEQ tablet TAKE 1 TABLET DAILY 90 tablet 2  . rosuvastatin (CRESTOR) 10 MG tablet Take 1 tablet (10 mg total) by mouth every Monday, Wednesday, and Friday. 40 tablet 3  . aspirin 81 MG tablet Take 81 mg by mouth daily.     No current facility-administered medications for this visit.     Allergies:   Etodolac; Amlodipine; Motrin [ibuprofen]; Statins; and Carvedilol    Social History:  The patient  reports that  has never smoked. she has never used smokeless tobacco. She reports that she does not drink alcohol or use drugs.   Family History:  The patient's family history is not on file.    ROS:  Please see the history of present illness. All other systems are reviewed and negative.    PHYSICAL EXAM: VS:  BP (!) 146/78   Pulse 72   Ht 5' 3.5" (1.613 m)   Wt 160 lb (72.6 kg)   BMI 27.90 kg/m  , BMI Body mass index is 27.9 kg/m. GEN: Well nourished, well developed, female in no acute distress  HEENT: normal for age  Neck: no JVD, + R carotid bruit, no masses Cardiac: RRR; soft murmur, no rubs, or gallops Respiratory:  clear to auscultation bilaterally, normal work of breathing GI: soft, nontender, nondistended, + BS MS: no deformity or atrophy; no edema; distal pulses are 2+ in all 4 extremities   Skin: warm and dry, no rash Neuro:  Strength and sensation are intact Psych: euthymic mood, full affect   EKG:  EKG is not ordered  today.   Recent Labs: 12/22/2016: ALT 13; BUN 18; Creatinine, Ser 0.72; Potassium 4.9; Sodium 143    Lipid Panel    Component Value Date/Time   CHOL 149 12/22/2016 1005   TRIG 123 12/22/2016 1005   HDL 49 12/22/2016 1005   CHOLHDL 3.0 12/22/2016 1005   CHOLHDL 3.7 12/23/2015 0849   VLDL 43 (H) 12/23/2015 0849   LDLCALC 75 12/22/2016 1005     Wt Readings from Last 3 Encounters:  04/18/17 160  lb (72.6 kg)  12/22/16 157 lb 6.4 oz (71.4 kg)  06/21/16 168 lb 9.6 oz (76.5 kg)     Other studies Reviewed: Additional studies/ records that were reviewed today include: Office notes, hospital records and testing.  ASSESSMENT AND PLAN:  1.  CAD: She is not having any ischemic symptoms.  She was advised that because she had no recent stents or other interventions, she did not need to be on both aspirin and Plavix and it was okay to stop the aspirin.  Continue Plavix.  2.  Hyperlipidemia: She is tolerating the rosuvastatin at 3 times a week.  She did not tolerate taking it every day.  Recheck a lipid profile prior to her follow-up with Dr. Herbie Baltimore and if her LDL is not at goal, consider PCSK9 inhibitor.  3.  Hypertension: She is tolerating the felodipine well.  I will increase it to 5 mg/day.  She was advised that her goal systolic blood pressure is 130.  If she does not get to target with the increase in the felodipine, consider increasing the HCTZ to 25 mg daily.  4.  Cerebrovascular disease: Carotid Dopplers were last done in June 2018 and she had 40-59% stenosis in her right ICA.  Recheck carotid Dopplers prior to follow-up with Dr. Herbie Baltimore   Current medicines are reviewed at length with the patient today.  The patient has concerns regarding medicines. Concerns were addressed The following changes have been made: Increase felodipine  Labs/ tests ordered today include:   Orders Placed This Encounter  Procedures  . Lipid panel  . Comprehensive Metabolic Panel (CMET)  Carotid  Dopplers  Disposition:   FU with Dr. Herbie Baltimore  Signed, Theodore Demark, PA-C  04/18/2017 1:06 PM    [facility] Phone: [phone]; Fax: [phone]  This note was written with the assistance of speech recognition software. Please excuse any transcriptional errors.

## 2017-04-18 NOTE — Patient Instructions (Signed)
Medication Instructions:   INCREASE FELODIPINE TO 5 MG ONCE DAILY= 2 OF THE 2.5 MG TABLETS ONCE DAILY  Labwork:  Your physician recommends that you return for lab work PRIOR TO FOLLOW UP WITH DR HARDING IN October=DO NOT EAT PRIOR TO LAB WORK  Testing/Procedures:  Your physician has requested that you have a carotid duplex. This test is an ultrasound of the carotid arteries in your neck. It looks at blood flow through these arteries that supply the brain with blood. Allow one hour for this exam. There are no restrictions or special instructions.SCHEDULE IN October PRIOR TO FOLLOW UP WITH DR Herbie BaltimoreHARDING    Follow-Up:  Your physician wants you to follow-up in: October WITH DR Herbie BaltimoreHARDING You will receive a reminder letter in the mail two months in advance. If you don't receive a letter, please call our office to schedule the follow-up appointment.   YOU MAY SPREAD OUT THE FISH OIL TABLETS THROUGHOUT THE DAY WITH MEALS

## 2017-05-02 ENCOUNTER — Telehealth: Payer: Self-pay | Admitting: Physician Assistant

## 2017-05-02 DIAGNOSIS — I10 Essential (primary) hypertension: Secondary | ICD-10-CM

## 2017-05-02 MED ORDER — FELODIPINE ER 2.5 MG PO TB24: 2.5000 mg | ORAL_TABLET | Freq: Every day | ORAL | Status: DC

## 2017-05-02 MED ORDER — HYDROCHLOROTHIAZIDE 12.5 MG PO CAPS: 12.5000 mg | ORAL_CAPSULE | Freq: Every day | ORAL | 3 refills | Status: DC

## 2017-05-02 NOTE — Telephone Encounter (Signed)
Spoke with pt, aware of rhonda's recommendations. New script sent to the pharmacy and Lab orders mailed to the pt

## 2017-05-02 NOTE — Addendum Note (Signed)
Addended by: Freddi StarrMATHIS, Elyssia Strausser W on: 05/02/2017 05:26 PM   Modules accepted: Orders

## 2017-05-02 NOTE — Telephone Encounter (Signed)
Spoke with pt, her bp has been doing good on the felodipine but the swelling is getting worse. Will forward for rhonda's review and advise.

## 2017-05-02 NOTE — Telephone Encounter (Signed)
Since the swelling is getting worse, go back to 2.5 mg of the felodipine. Increase the HCTZ to 25 mg qd Needs a BMET in 2 weeks Thanks

## 2017-05-02 NOTE — Telephone Encounter (Signed)
New Message   Pt c/o medication issue:  1. Name of Medication: felodipine (PLENDIL) 5 MG 24 hr tablet  2. How are you currently taking this medication (dosage and times per day)? 5mg  once a day.   3. Are you having a reaction (difficulty breathing--STAT)? Swelling   4. What is your medication issue? Patient states that she is experiencing swelling in the left leg as well as pain in the side.

## 2017-05-16 LAB — BASIC METABOLIC PANEL
BUN / CREAT RATIO: 23 (ref 12–28)
BUN: 19 mg/dL (ref 8–27)
CALCIUM: 9.9 mg/dL (ref 8.7–10.3)
CHLORIDE: 103 mmol/L (ref 96–106)
CO2: 28 mmol/L (ref 20–29)
CREATININE: 0.81 mg/dL (ref 0.57–1.00)
GFR calc Af Amer: 85 mL/min/{1.73_m2} (ref >59)
GFR, EST NON AFRICAN AMERICAN: 73 mL/min/{1.73_m2} (ref >59)
Glucose: 71 mg/dL (ref 65–99)
Potassium: 4.4 mmol/L (ref 3.5–5.2)
Sodium: 144 mmol/L (ref 134–144)

## 2017-05-29 ENCOUNTER — Other Ambulatory Visit: Payer: Self-pay | Admitting: Cardiology

## 2017-07-04 ENCOUNTER — Other Ambulatory Visit: Payer: Self-pay | Admitting: Cardiology

## 2017-07-04 NOTE — Telephone Encounter (Signed)
Rx sent to pharmacy   

## 2017-07-20 ENCOUNTER — Telehealth: Payer: Self-pay

## 2017-07-20 NOTE — Telephone Encounter (Signed)
   [facility] Pre-operative Risk Assessment    Request for surgical clearance:  1. What type of surgery is being performed? Cataract extraction with intraocular lens implantation of the right eye followed by the left  2. When is this surgery scheduled? 08/29/17   3. What type of clearance is required (medical clearance vs. Pharmacy clearance to hold med vs. Both)? Medical  4. Are there any medications that need to be held prior to surgery and how long? None   5. Practice name and name of physician performing surgery? [facility]   6. What is your office phone number [phone]   7.   What is your office fax number [phone] Attn: Sharyn Lull  8.   Anesthesia type (None, local, MAC, general) ? Topical with IV     medication   Meryl Crutch 07/20/2017, 3:15 PM  _________________________________________________________________   (provider comments below)

## 2017-07-21 NOTE — Telephone Encounter (Signed)
PT SCHEDULED 08-22-17 WITH WEAVER

## 2017-07-21 NOTE — Telephone Encounter (Signed)
   Primary Cardiologist: Bryan Lemma, MD  Chart reviewed as part of pre-operative protocol coverage. Patient was contacted 07/21/2017 in reference to pre-operative risk assessment for pending surgery as outlined below.  [name] was last seen on 04/18/17 by Theodore Demark.  Since that day, [name] has done well, but still reports worsening lower extremity swelling. .  Due to new or worsening symptoms, [name] will require a follow-up visit for further pre-operative risk assessment and lower extremity swelling.  Pre-op covering staff: - Please schedule appointment and call patient to inform them. - Please contact requesting surgeon's office via preferred method (i.e, phone, fax) to inform them of need for appointment prior to surgery.   Roe Rutherford Duke, PA 07/21/2017, 2:53 PM

## 2017-07-25 ENCOUNTER — Telehealth: Payer: Self-pay | Admitting: *Deleted

## 2017-07-25 NOTE — Telephone Encounter (Signed)
LEFT MESSAGE ON VOICEMAIL PATIENT NEEDS AN APPOINTMENT ( SCHEDULE FOR 08/22/17) FOR CARDIAC CLEARANCE FOR CATARACT SURGERY  08/29/17

## 2017-08-22 ENCOUNTER — Encounter: Payer: Self-pay | Admitting: Physician Assistant

## 2017-08-22 ENCOUNTER — Ambulatory Visit (INDEPENDENT_AMBULATORY_CARE_PROVIDER_SITE_OTHER): Payer: Medicare Other | Admitting: Physician Assistant

## 2017-08-22 ENCOUNTER — Telehealth: Payer: Self-pay | Admitting: *Deleted

## 2017-08-22 VITALS — BP 138/78 | HR 87 | Ht 63.0 in | Wt 161.0 lb

## 2017-08-22 DIAGNOSIS — I2510 Atherosclerotic heart disease of native coronary artery without angina pectoris: Secondary | ICD-10-CM

## 2017-08-22 DIAGNOSIS — Z01810 Encounter for preprocedural cardiovascular examination: Secondary | ICD-10-CM

## 2017-08-22 DIAGNOSIS — I10 Essential (primary) hypertension: Secondary | ICD-10-CM | POA: Diagnosis not present

## 2017-08-22 DIAGNOSIS — I878 Other specified disorders of veins: Secondary | ICD-10-CM

## 2017-08-22 MED ORDER — FELODIPINE ER 2.5 MG PO TB24: 2.5000 mg | ORAL_TABLET | Freq: Every day | ORAL | 3 refills | Status: DC

## 2017-08-22 NOTE — Progress Notes (Signed)
Cardiology Office Note:    Date:  08/22/2017   ID:  [name], DOB [birth year], MRN [id number]  PCP:  Asencion GowdaMitchell, L.August Saucerean, MD  Cardiologist:  Bryan Lemmaavid Harding, MD   Referring MD: Asencion GowdaMitchell, L.August Saucerean, MD   Chief Complaint  Patient presents with  . Surgical Clearance    History of Present Illness:    [name] is a 72 y.o. female with coronary artery disease status post CABG in 2003, hypertension, hyperlipidemia, carotid artery disease, venous stasis.  She has a history of angina controlled on medical therapy.  Cardiac catheterization in 2008 demonstrated patent LIMA-LAD and patent SVG-LCx.  She has a known occlusion of the SVG-diagonal.  Last nuclear stress test in 2016 demonstrated no ischemia.  She was last seen in clinic by Theodore Demarkhonda Barrett, PA-C in 04/2017.  She had worsening lower extremity swelling with increased dose of felodipine.  Her diuretic was adjusted and her felodipine dose was reduced.  She was recently evaluated by the preop clinic for cataract surgery.  She was noted to have worsening lower extremity swelling and follow-up was arranged today.  Ms. Pamela CaseyRice is here alone today.  She is overall doing well.  She denies chest pain, syncope, paroxysmal nocturnal dyspnea.  Her lower extremity swelling is overall stable.  She has noted improvement with compression stockings.  However, they are uncomfortable and she cannot wear them for long.  She has noted some dyspnea on exertion with mod to extreme exertion.  She has not noticed any worsening.    Prior CV studies:   The following studies were reviewed today:  Carotid US 08/16/2016 R 40-59; L 1-39 >> follow-up 1 year   Nuclear stress test 12/06/2014 Normal stress nuclear study with a small, moderate intensity, fixed distal anterior and apical defect consistent with soft tissue attenuation; no ischemia; EF 69 with normal wall motion.  Echo 07/06/2011 EF >55, mild posterior wall HK, impaired LV relaxation, mildly sclerotic aortic  valve  Nuclear stress test 09/23/2010 Breast attenuation, no ischemia or scar, EF 79  Cardiac Catheterization 10/24/2006 LAD proximal 90 LCx 100 RCA patent LIMA-LAD patent SVG-LCx patent SVG-D1 occluded EF 45-50  Cardiac catheterization 11/13/2001 LM ostial 70 LAD okay LCx 100 RCA okay EF 55-60  Past Medical History:  Diagnosis Date  . Bilateral lower extremity edema - mild, likely venous stasis 05/16/2013  . CAD (coronary artery disease) of bypass graft 2004/2008   Cath 2008: SVG-D1 Occluded with patent D1, LM 90%, Patent LIMA-LAD & SVG-OM1, patent Native RCA, EF ~45-50% & 2+ MR.; Myoview 2012: No ischemia or infarction, Normal EF; Echo 2013: Nl LV Fxn & normal valves   . Essential hypertension   . H/O unstable angina  2003   Cardiac cath with multivessel disease referred for CABG  . Hyperlipidemia LDL goal <70   . Hypertension, benign   . Left main coronary artery disease 2003   70% ostial left main, 100% Cx,; patent, normal RCA  . Leg edema, left 04/08/2015  . Medication management 01/08/2016  . 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 Laneta SimmersBartle)    Surgical Hx: The patient  has a past surgical history that includes Cardiac catheterization Karlyne Greenspan( Novembeer 2003); Coronary artery bypass graft (November 2003); Cardiac catheterization (2004, 2008); [facility] MYOVIEW LTD (2012; September 2016); transthoracic echocardiogram (2013); and Carotid Duplex Ultrasound (July 2014).   Current Medications: Current Meds  Medication Sig  . cholecalciferol (VITAMIN D) 1000 UNITS tablet Take 1,000 Units by mouth daily.  .Marland Kitchen  clopidogrel (PLAVIX) 75 MG tablet Take 75 mg by mouth daily.  . Coenzyme Q10-Vitamin E (QUNOL ULTRA COQ10 PO) Take 1 capsule by mouth daily.  . felodipine (PLENDIL) 2.5 MG 24 hr tablet Take 1 tablet (2.5 mg total) by mouth daily.  . fexofenadine (ALLEGRA) 180 MG tablet Take 180 mg by mouth daily as needed for allergies or rhinitis.  . folic acid (FOLVITE) 400 MCG tablet  Take 800 mcg by mouth daily.   . irbesartan-hydrochlorothiazide (AVALIDE) 300-12.5 MG per tablet Take 1 tablet by mouth daily.   . Magnesium 250 MG TABS Take 1 tablet by mouth daily.  . metoprolol succinate (TOPROL-XL) 100 MG 24 hr tablet TAKE 1 TABLET DAILY WITH OR IMMEDIATELY FOLLOWING A MEAL  . Multiple Vitamin (MULTIVITAMIN) capsule Take 1 capsule by mouth daily.  . Omega-3 Fatty Acids (FISH OIL) 1200 MG CAPS Take 3 capsules (3,600 mg total) by mouth daily.  . potassium chloride SA (K-DUR,KLOR-CON) 20 MEQ tablet TAKE 1 TABLET DAILY  . rosuvastatin (CRESTOR) 10 MG tablet Take 1 tablet (10 mg total) by mouth every Monday, Wednesday, and Friday.     Allergies:   Etodolac; Amlodipine; Motrin [ibuprofen]; Statins; and Carvedilol   Social History   Tobacco Use  . Smoking status: Never Smoker  . Smokeless tobacco: Never Used  Substance Use Topics  . Alcohol use: No  . Drug use: No     Family Hx: The patient's family history is negative for Breast cancer.  ROS:   Please see the history of present illness.    Review of Systems  Cardiovascular: Positive for leg swelling.   All other systems reviewed and are negative.   EKGs/Labs/Other Test Reviewed:    EKG:  EKG is  ordered today.  The ekg ordered today demonstrates normal sinus rhythm, HR 87, normal axis, non-specific ST-TW changes, QTc 462 ms, no changes from 12/22/16  Recent Labs: 12/22/2016: ALT 13 05/16/2017: BUN 19; Creatinine, Ser 0.81; Potassium 4.4; Sodium 144   Recent Lipid Panel Lab Results  Component Value Date/Time   CHOL 149 12/22/2016 10:05 AM   TRIG 123 12/22/2016 10:05 AM   HDL 49 12/22/2016 10:05 AM   CHOLHDL 3.0 12/22/2016 10:05 AM   CHOLHDL 3.7 12/23/2015 08:49 AM   LDLCALC 75 12/22/2016 10:05 AM    Physical Exam:    VS:  BP 138/78   Pulse 87   Ht 5\' 3"  (1.6 m)   Wt 161 lb (73 kg)   SpO2 98%   BMI 28.52 kg/m     Wt Readings from Last 3 Encounters:  08/22/17 161 lb (73 kg)  04/18/17 160 lb  (72.6 kg)  12/22/16 157 lb 6.4 oz (71.4 kg)     Physical Exam  Constitutional: She is oriented to person, place, and time. She appears well-developed and well-nourished. No distress.  HENT:  Head: Normocephalic and atraumatic.  Neck: Neck supple. No JVD present.  Cardiovascular: Normal rate, regular rhythm, S1 normal, S2 normal and normal heart sounds.  No murmur heard. Pulmonary/Chest: Effort normal. She has no rales.  Abdominal: Soft. There is no hepatomegaly.  Musculoskeletal: She exhibits edema (very trace bilat LE edema; multiple varicosities noted bilaterally).  Neurological: She is alert and oriented to person, place, and time.  Skin: Skin is warm and dry.    ASSESSMENT & PLAN:    Preoperative cardiovascular examination  The patient needs cataract surgery next week.  This is a low risk procedure.  From a cardiac standpoint, she is stable.  She denies any anginal symptoms.  She does not have any evidence of volume excess.  She may proceed with cataract surgery at acceptable risk.  Venous stasis of both lower extremities She has chronic lower extremity edema related to venous stasis.  She has tried Lasix in the past without much improvement.  I have encouraged her to try to use compression stockings.   I have provided her with the name of the store in [place].  I also recommended continued leg elevation.  Coronary artery disease involving native coronary artery of native heart without angina pectoris Status post CABG in 2003.  She has a known occluded vein graft to the diagonal.  Nuclear stress test in 2016 was low risk.  She denies angina.  Continue Plavix, beta-blocker, statin.  Essential hypertension   The patient's blood pressure is controlled on her current regimen.  Continue current therapy.    Dispo:  Return in about 4 months (around 12/22/2017) for Routine Follow Up w/ Dr. Herbie Baltimore.   Medication Adjustments/Labs and Tests Ordered: Current medicines are reviewed at  length with the patient today.  Concerns regarding medicines are outlined above.  Tests Ordered: Orders Placed This Encounter  Procedures  . EKG 12-Lead   Medication Changes: Meds ordered this encounter  Medications  . felodipine (PLENDIL) 2.5 MG 24 hr tablet    Sig: Take 1 tablet (2.5 mg total) by mouth daily.    Dispense:  90 tablet    Refill:  3    Order Specific Question:   Supervising Provider    Answer:   Vesta Mixer [[id number]]    Signed, Tereso Newcomer, PA-C  08/22/2017 12:53 PM    [facility] [address] [place], [place], [place]  [address] Phone: [phone]; Fax: [phone]

## 2017-08-22 NOTE — Patient Instructions (Signed)
Medication Instructions:  No changes.  See your medication list.  Labwork: None   Testing/Procedures: None   Follow-Up: October 2019 with Bryan Lemmaavid Harding, MD   Any Other Special Instructions Will Be Listed Below (If Applicable). You can get compression stockings in [place] at [facility]  Phone # [phone].   If you need a refill on your cardiac medications before your next appointment, please call your pharmacy.

## 2017-08-22 NOTE — Telephone Encounter (Signed)
Per Tereso NewcomerScott Weaver, PA I called Dr. Florence CannerBevis's office to confirm if OV note from visit today received. I left VM for surgery scheduler if OV note not received to please call the office [phone] and I will send a hard copy fax.

## 2017-08-22 NOTE — Telephone Encounter (Signed)
-----   Message from Beatrice LecherScott T Weaver, [place]-C sent at 08/22/2017 12:57 PM EDT ----- Please make sure Dr[facility] Vonna [facility]ykBevis [facility]) received my note from today. Tereso NewcomerScott Weaver, PA-C    08/22/2017 12:57 PM

## 2017-08-22 NOTE — Progress Notes (Signed)
Per Scott Weaver, PA I called Dr. Bevis's office to confirm if OV note from visit today received. I left VM for surgery scheduler if OV note not received to please call the office [phone] and I will send a hard copy fax.  

## 2017-09-01 ENCOUNTER — Other Ambulatory Visit: Payer: Self-pay | Admitting: *Deleted

## 2017-09-01 MED ORDER — HYDROCHLOROTHIAZIDE 12.5 MG PO CAPS: 12.5000 mg | ORAL_CAPSULE | Freq: Every day | ORAL | 3 refills | Status: DC

## 2017-10-19 ENCOUNTER — Telehealth: Payer: Self-pay | Admitting: Cardiology

## 2017-10-19 MED ORDER — NITROGLYCERIN 0.4 MG SL SUBL: 0.4000 mg | SUBLINGUAL_TABLET | SUBLINGUAL | 4 refills | Status: DC | PRN

## 2017-10-19 NOTE — Telephone Encounter (Signed)
Rx sent to requested pharmacy. Thanks!

## 2017-10-19 NOTE — Telephone Encounter (Signed)
Pt is request Rx for Nitroglycerin tablets. It does not appear on her med list and has not been on her med list dating back to 602015.   Ok to send this in for her? Pt saw Ryder SystemScott weaver, [place] for surgical clearance in June. Is due for appointment with you in October.

## 2017-10-19 NOTE — Telephone Encounter (Signed)
OK to prescribe 

## 2017-10-19 NOTE — Telephone Encounter (Signed)
°*  STAT* If patient is at the pharmacy, call can be transferred to refill team.   1. Which medications need to be refilled? (please list name of each medication and dose if known)  Need new prescription for Nitroglycerin  2. Which pharmacy/location (including street and city if local pharmacy) is medication to be sent to?[facility] [phone] 3. Do they need a 30 day or 90 day supply?  1 bottle

## 2017-10-25 ENCOUNTER — Other Ambulatory Visit: Payer: Self-pay | Admitting: Cardiology

## 2017-11-07 ENCOUNTER — Other Ambulatory Visit: Payer: Self-pay | Admitting: Cardiology

## 2017-12-13 ENCOUNTER — Ambulatory Visit (HOSPITAL_COMMUNITY)
Admission: RE | Admit: 2017-12-13 | Discharge: 2017-12-13 | Disposition: A | Discharge status: Home / Self Care | Payer: Medicare Other | Source: Ambulatory Visit | Attending: Cardiovascular Disease | Admitting: Cardiovascular Disease

## 2017-12-13 DIAGNOSIS — I679 Cerebrovascular disease, unspecified: Secondary | ICD-10-CM | POA: Diagnosis present

## 2017-12-19 LAB — COMPREHENSIVE METABOLIC PANEL
ALBUMIN: 4.5 g/dL (ref 3.5–4.8)
ALT: 13 IU/L (ref 0–32)
AST: 9 IU/L (ref 0–40)
Albumin/Globulin Ratio: 2.4 — ABNORMAL HIGH (ref 1.2–2.2)
Alkaline Phosphatase: 55 IU/L (ref 39–117)
BUN / CREAT RATIO: 26 (ref 12–28)
BUN: 19 mg/dL (ref 8–27)
Bilirubin Total: 0.4 mg/dL (ref 0.0–1.2)
CALCIUM: 9.4 mg/dL (ref 8.7–10.3)
CO2: 25 mmol/L (ref 20–29)
CREATININE: 0.72 mg/dL (ref 0.57–1.00)
Chloride: 102 mmol/L (ref 96–106)
GFR calc Af Amer: 97 mL/min/{1.73_m2} (ref >59)
GFR, EST NON AFRICAN AMERICAN: 84 mL/min/{1.73_m2} (ref >59)
GLUCOSE: 92 mg/dL (ref 65–99)
Globulin, Total: 1.9 g/dL (ref 1.5–4.5)
Potassium: 3.9 mmol/L (ref 3.5–5.2)
Sodium: 143 mmol/L (ref 134–144)
Total Protein: 6.4 g/dL (ref 6.0–8.5)

## 2017-12-19 LAB — LIPID PANEL
Chol/HDL Ratio: 3.4 ratio (ref 0.0–4.4)
Cholesterol, Total: 182 mg/dL (ref 100–199)
HDL: 54 mg/dL (ref >39)
LDL Calculated: 87 mg/dL (ref 0–99)
Triglycerides: 204 mg/dL — ABNORMAL HIGH (ref 0–149)
VLDL Cholesterol Cal: 41 mg/dL — ABNORMAL HIGH (ref 5–40)

## 2017-12-20 ENCOUNTER — Encounter: Payer: Self-pay | Admitting: Cardiology

## 2017-12-20 ENCOUNTER — Ambulatory Visit (INDEPENDENT_AMBULATORY_CARE_PROVIDER_SITE_OTHER): Payer: Medicare Other | Admitting: Cardiology

## 2017-12-20 VITALS — BP 132/78 | HR 71 | Ht 63.0 in | Wt 162.8 lb

## 2017-12-20 DIAGNOSIS — I10 Essential (primary) hypertension: Secondary | ICD-10-CM

## 2017-12-20 DIAGNOSIS — R600 Localized edema: Secondary | ICD-10-CM | POA: Diagnosis not present

## 2017-12-20 DIAGNOSIS — I25810 Atherosclerosis of coronary artery bypass graft(s) without angina pectoris: Secondary | ICD-10-CM | POA: Diagnosis not present

## 2017-12-20 DIAGNOSIS — E785 Hyperlipidemia, unspecified: Secondary | ICD-10-CM | POA: Diagnosis not present

## 2017-12-20 MED ORDER — FUROSEMIDE 20 MG PO TABS: 20.0000 mg | ORAL_TABLET | Freq: Every day | ORAL | 3 refills | Status: DC

## 2017-12-20 MED ORDER — ROSUVASTATIN CALCIUM 10 MG PO TABS: 10.0000 mg | ORAL_TABLET | Freq: Every day | ORAL | 3 refills | Status: DC

## 2017-12-20 NOTE — Patient Instructions (Signed)
Medication Instructions:  STOP HCTZ  START FUROSEMIDE 20 MG ONCE DAILY AS NEEDED  GRADUALLY INCREASE ROSUVASTATIN AS TOLERATED TO 5 DAYS WEEK If you need a refill on your cardiac medications before your next appointment, please call your pharmacy.   Lab work: Your physician recommends that you return for lab work PRIOR TO FOLLOW UP APPOINTMENT IN 6 MONTHS If you have labs (blood work) drawn today and your tests are completely normal, you will receive your results only by: Marland Kitchen MyChart Message (if you have MyChart) OR . A paper copy in the mail If you have any lab test that is abnormal or we need to change your treatment, we will call you to review the results.  Follow-Up: Your physician wants you to follow-up in: 6 MONTHS WITH RHONDA BARRETT PA. YOU WILL NEED TO CALL 2 MONTHS PRIOR TO THE APPOINTMENT TIME TO SCHEDULE.  Your physician wants you to follow-up in: ONE YEAR WITH DR [facility]. YOU WILL NEED TO CALL 2 MONTHS PRIOR TO APPOINTMENT TIME TO SCHEDULE.

## 2017-12-20 NOTE — Progress Notes (Signed)
PCP: Clovis Riley, L.August Saucer, MD  Clinic Note: Chief Complaint  Patient presents with  . Follow-up    Still having issues with swelling  . Coronary Artery Disease  . Leg Swelling    Worse when taking HCTZ    HPI: [name] is a 71 y.o. female with a PMHof CAD-CABG below who presents today for ~ 4 month f/u.  She has a distant history of LM CAD --> CABG back in 2000;   Cath 2008. w/ occluded SVG-D1,   Last Myoview was in 2016: Low risk.  normal EF and no ischemia, infarct.  Likely breast attenuation/apical thinning  JAGGER BEAHM was last seen in Feb 2019 by Theodore Demark, PA- recommendation was to increase walking a compression stockings, elevate legs.  Was walking 10-15 min/day Increased Amlodipine dose to 5 mg. She was then seen on June 16 for preop evaluation for cataract surgery by Tereso Newcomer, PA.  Apparently she had worsening lower extremity edema --> had not noted results with Lasix.. ->  Edema seem to be better controlled using compression stockings, but there is somewhat uncomfortable.  She also noted exertional dyspnea with moderate to extreme exertion.  Recent Hospitalizations: none;  outpatient cataract  Studies Reviewed:   Carotid Dopplers Oct 2019: Moderate right carotid disease (40 and 59%), mild left less than 40%.  Interval History: [name] presents today relatively stable from a cardiac standpoint.  She has been started on HCTZ for swelling and blood pressure, and indicates that she actually notes that edema getting worse with the HCTZ.  She did a trial off of HCTZ while visiting her mother in [place], and the edema got better than his switch to.  HCTZ exam the edema got worse.  She denies any PND orthopnea and currently without taking her HCTZ her edema is pretty well controlled using using support/compression stockings.  Left leg is worse than the right, better.  It is hard to wear the stockings in the summertime. Still uses CPAP. Regarding standpoint she is  pretty stable with no chest pain with rest or exertion.  She still will get short of breath if she pushes himself hard, but notes that over the last month or so she is not not been exercising as much because she had a fall.  She hurt her knee during the fall and has not recovered yet  She seems to be tolerating her Crestor now 3 days a week, but did not titrate further. No sensation of rapid irregular heartbeats palpitations.  No syncope/near syncope or TIA/amorous fugax.  No claudication  ROS: A comprehensive was performed. Review of Systems  Constitutional: Negative for chills, fever, malaise/fatigue and weight loss.  HENT: Negative for nosebleeds.   Respiratory: Negative for cough and shortness of breath.   Cardiovascular: Positive for leg swelling (left leg>> right -see HPI). Negative for claudication.  Gastrointestinal: Negative for blood in stool, heartburn and melena.  Genitourinary: Negative for hematuria.  Musculoskeletal: Positive for joint pain (Left knee). Negative for falls and myalgias (Cramps are much better).  Neurological: Negative for dizziness, focal weakness, loss of consciousness, weakness and headaches.  Endo/Heme/Allergies: Does not bruise/bleed easily.  Psychiatric/Behavioral: Negative for depression and memory loss. The patient is not nervous/anxious and does not have insomnia.   All other systems reviewed and are negative.   Past Medical History:  Diagnosis Date  . Bilateral lower extremity edema - mild, likely venous stasis 05/16/2013  . CAD (coronary artery disease) of bypass graft 2004/2008   Cath  2008: SVG-D1 Occluded with patent D1, LM 90%, Patent LIMA-LAD & SVG-OM1, patent Native RCA, EF ~45-50% & 2+ MR.; Myoview 2012: No ischemia or infarction, Normal EF; Echo 2013: Nl LV Fxn & normal valves   . Essential hypertension   . H/O unstable angina  2003   Cardiac cath with multivessel disease referred for CABG  . Hyperlipidemia LDL goal <70   . Hypertension,  benign   . Left main coronary artery disease 2003   70% ostial left main, 100% Cx,; patent, normal RCA  . Leg edema, left 04/08/2015  . Medication management 01/08/2016  . 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 Laneta Simmers)     Past Surgical History:  Procedure Laterality Date  . CARDIAC CATHETERIZATION   Novembeer 2003   70% left main, 100 % Cx @ OM; normal RCA  . CARDIAC CATHETERIZATION  2004, 2008   Occluded SVG-D1 with antegrade native D1 flow; LM increased from 70-90% with patent LIMA and SVG-OM1; patent RCA ; EF of 45-50% with 2+ MR   . Carotid Duplex Ultrasound  July 2014   Less than 49% right carotid; relatively normal left carotid.  . CORONARY ARTERY BYPASS GRAFT  November 2003   Dr. Laneta Simmers: LIMA-LAD, SVG-OM 1, SVG-D1  . NM MYOVIEW LTD  2012; September 2016   a. No evidence of ischemia, normal EF;; b. Hyperdynamic LV, EF 69%. Hypertensive response to exercise. ST depressions in V5 and V6. Small, moderate intensity, fixed defect in the distal anterior and apical wall consistent with soft tissue attenuation. LOW RISK.  Marland Kitchen TRANSTHORACIC ECHOCARDIOGRAM  2013   Normal LV function with normal valves.    Current Meds  Medication Sig  . cholecalciferol (VITAMIN D) 1000 UNITS tablet Take 1,000 Units by mouth daily.  . clopidogrel (PLAVIX) 75 MG tablet Take 75 mg by mouth daily.  . Coenzyme Q10-Vitamin E (QUNOL ULTRA COQ10 PO) Take 1 capsule by mouth daily.  . felodipine (PLENDIL) 2.5 MG 24 hr tablet Take 1 tablet (2.5 mg total) by mouth daily.  . fexofenadine (ALLEGRA) 180 MG tablet Take 180 mg by mouth daily as needed for allergies or rhinitis.  . folic acid (FOLVITE) 400 MCG tablet Take 800 mcg by mouth daily.   . irbesartan-hydrochlorothiazide (AVALIDE) 300-12.5 MG per tablet Take 1 tablet by mouth daily.   . Magnesium 250 MG TABS Take 1 tablet by mouth daily.  . metoprolol succinate (TOPROL-XL) 100 MG 24 hr tablet TAKE 1 TABLET DAILY WITH OR IMMEDIATELY FOLLOWING A  MEAL  . Multiple Vitamin (MULTIVITAMIN) capsule Take 1 capsule by mouth daily.  . nitroGLYCERIN (NITROSTAT) 0.4 MG SL tablet Place 1 tablet (0.4 mg total) under the tongue every 5 (five) minutes as needed for chest pain.  . Omega-3 Fatty Acids (FISH OIL) 1200 MG CAPS Take 3 capsules (3,600 mg total) by mouth daily.  . potassium chloride SA (K-DUR,KLOR-CON) 20 MEQ tablet TAKE 1 TABLET DAILY  . rosuvastatin (CRESTOR) 10 MG tablet Take 1 tablet (10 mg total) by mouth daily.  . [DISCONTINUED] hydrochlorothiazide (MICROZIDE) 12.5 MG capsule Take 1 capsule (12.5 mg total) by mouth daily.  . [DISCONTINUED] rosuvastatin (CRESTOR) 10 MG tablet TAKE 1 TABLET EVERY MONDAY, WEDNESDAY AND FRIDAY    Allergies  Allergen Reactions  . Etodolac Anaphylaxis    migraines  . Amlodipine     PATIENT NOT SURE THINKS LEG SWELLING. USE IN THE 1990'S  . Motrin [Ibuprofen] Swelling    Throat swells  . Statins  Myalgias   . Carvedilol Rash    Social History   Tobacco Use  . Smoking status: Never Smoker  . Smokeless tobacco: Never Used  Substance Use Topics  . Alcohol use: No  . Drug use: No   Social History   Social History Narrative   72 year old married white woman, mother of 3, grandmother of 4.   Never smoked. Rare social alcohol.   She is a Hotel manager around the house, and does lots of the chart work.   Her husband is also a former patient Dr. Alanda Amass, and now follows up with Dr. Herbie Baltimore.    Family History  Problem Relation Age of Onset  . Breast cancer Neg Hx     Wt Readings from Last 3 Encounters:  12/20/17 162 lb 12.8 oz (73.8 kg)  08/22/17 161 lb (73 kg)  04/18/17 160 lb (72.6 kg)    PHYSICAL EXAM BP 132/78   Pulse 71   Ht 5\' 3"  (1.6 m)   Wt 162 lb 12.8 oz (73.8 kg)   BMI 28.84 kg/m   Physical Exam  Constitutional: She is oriented to person, place, and time. She appears well-developed and well-nourished. No distress.  Well-groomed. Pleasant mood and affect  HENT:  Head:  Normocephalic and atraumatic.  Mouth/Throat: Oropharynx is clear and moist. No oropharyngeal exudate.  Neck: Normal range of motion. Neck supple. No hepatojugular reflux and no JVD present. Carotid bruit is not present. No tracheal deviation present. No thyromegaly present.  Cardiovascular: Normal rate, regular rhythm, S1 normal, S2 normal and normal pulses.  No extrasystoles are present. PMI is not displaced. Exam reveals no gallop.  Murmur heard.  Decrescendo holosystolic murmur is present with a grade of 1/6 at the lower left sternal border and apex. Pulmonary/Chest: Effort normal and breath sounds normal. No respiratory distress. She has no wheezes. She has no rales.  Musculoskeletal: Normal range of motion. She exhibits edema (1-2+ left leg,ith trace right leg edema with mild varicose veins. -Is not wearing her support stockings today, but usually does wear them.).  Neurological: She is alert and oriented to person, place, and time. She exhibits normal muscle tone.  Skin: Skin is warm and dry. No rash noted. No erythema.   No significant venous stasis dermatitis noted.  Psychiatric: She has a normal mood and affect. Her behavior is normal. Judgment and thought content normal.  Nursing note and vitals reviewed.   Adult ECG Report Normal sinus rhythm, rate 71 BPM. Normal axis, intervals and durations. Stable EKG.  Other studies Reviewed: Additional studies/ records that were reviewed today include:  Recent Labs:   Lab Results  Component Value Date   CHOL 182 12/19/2017   HDL 54 12/19/2017   LDLCALC 87 12/19/2017   TRIG 204 (H) 12/19/2017   CHOLHDL 3.4 12/19/2017    ASSESSMENT / PLAN: Problem List Items Addressed This Visit    Bilateral lower extremity edema - mild, likely venous stasis (Chronic)    Edema is notably improved with support stockings, but interestingly, she did not have any benefit from Lasix initially and felt like she got worsening edema with HCTZ.  At this point I  discussed DC HCTZ by itself and go back to as needed Lasix. Continue to wear support stockings which is probably the most effective along with foot elevation.  Continue walking.        Coronary artery disease involving coronary bypass graft - occluded SVG-D1 - Primary (Chronic)    Pretty much doing well without any  anginal symptoms.  May be a little bit of exertional dyspnea if she really pushes it but nothing else of concern. Myoview in 2016- for ischemia.  Did have a distal defect that could be related to her occluded vein graft to the D1.  Plan: Continue Plavix without aspirin --> okay to hold if necessary for surgery  Continue metoprolol and felodipine which is at 5 mg daily.  Continue ARB.  Continue Crestor which we are going to gradually titrate up.  Would probably be due for stress test after next follow-up for surveillance.       Relevant Medications   furosemide (LASIX) 20 MG tablet   rosuvastatin (CRESTOR) 10 MG tablet   Essential hypertension (Chronic)    Blood pressure looks actually pretty well controlled today.  I think we can probably get rid of the HCTZ 12.5 mg.  She is already getting some from the ACE inhibitor combination medication. We will simply switch her to Lasix 20mg  for edema.      Relevant Medications   furosemide (LASIX) 20 MG tablet   rosuvastatin (CRESTOR) 10 MG tablet   Hyperlipidemia LDL goal <70 (Chronic)    Unfortunately, her LDL went up to 87 from 75 when we backed down on her Crestor.  I want to see if we can gradually titrate her back up to 5 tabs a week.  Otherwise, we may need to consider PCSK9 inhibitor. Would like to have her labs checked before six-month follow-up.      Relevant Medications   furosemide (LASIX) 20 MG tablet   rosuvastatin (CRESTOR) 10 MG tablet   Other Relevant Orders   Lipid panel   Hepatic function panel     Flu shot given today  Current medicines are reviewed at length with the patient today. (+/- concerns)  cramps with Crestor stable; swelling with HCTZ The following changes have been made:See below  Patient Instructions  Medication Instructions:  STOP HCTZ  START FUROSEMIDE 20 MG ONCE DAILY AS NEEDED  GRADUALLY INCREASE ROSUVASTATIN AS TOLERATED TO 5 DAYS WEEK If you need a refill on your cardiac medications before your next appointment, please call your pharmacy.   Lab work: Your physician recommends that you return for lab work PRIOR TO FOLLOW UP APPOINTMENT IN 6 MONTHS If you have labs (blood work) drawn today and your tests are completely normal, you will receive your results only by: Marland Kitchen MyChart Message (if you have MyChart) OR . A paper copy in the mail If you have any lab test that is abnormal or we need to change your treatment, we will call you to review the results.  Follow-Up: Your physician wants you to follow-up in: 6 MONTHS WITH RHONDA BARRETT PA. YOU WILL NEED TO CALL 2 MONTHS PRIOR TO THE APPOINTMENT TIME TO SCHEDULE.  Your physician wants you to follow-up in: ONE YEAR WITH DR [facility]. YOU WILL NEED TO CALL 2 MONTHS PRIOR TO APPOINTMENT TIME TO SCHEDULE.   Orders Placed This Encounter  Procedures  . Lipid panel  . Hepatic function panel     [name], M.D., M.S. Interventional Cardiologist   Pager # [phone] Phone # [phone] [address] [place], [place] [address]

## 2017-12-22 ENCOUNTER — Encounter: Payer: Self-pay | Admitting: Cardiology

## 2017-12-22 NOTE — Assessment & Plan Note (Signed)
Pretty much doing well without any anginal symptoms.  May be a little bit of exertional dyspnea if she really pushes it but nothing else of concern. Myoview in 2016- for ischemia.  Did have a distal defect that could be related to her occluded vein graft to the D1.  Plan: Continue Plavix without aspirin --> okay to hold if necessary for surgery  Continue metoprolol and felodipine which is at 5 mg daily.  Continue ARB.  Continue Crestor which we are going to gradually titrate up.  Would probably be due for stress test after next follow-up for surveillance.

## 2017-12-22 NOTE — Assessment & Plan Note (Signed)
Blood pressure looks actually pretty well controlled today.  I think we can probably get rid of the HCTZ 12.5 mg.  She is already getting some from the ACE inhibitor combination medication. We will simply switch her to Lasix 20mg  for edema.

## 2017-12-22 NOTE — Assessment & Plan Note (Signed)
Unfortunately, her LDL went up to 87 from 75 when we backed down on her Crestor.  I want to see if we can gradually titrate her back up to 5 tabs a week.  Otherwise, we may need to consider PCSK9 inhibitor. Would like to have her labs checked before six-month follow-up.

## 2017-12-22 NOTE — Assessment & Plan Note (Addendum)
Edema is notably improved with support stockings, but interestingly, she did not have any benefit from Lasix initially and felt like she got worsening edema with HCTZ.  At this point I discussed DC HCTZ by itself and go back to as needed Lasix. Continue to wear support stockings which is probably the most effective along with foot elevation.  Continue walking.

## 2018-02-25 ENCOUNTER — Emergency Department (HOSPITAL_COMMUNITY): Payer: Medicare Other

## 2018-02-25 ENCOUNTER — Encounter (HOSPITAL_COMMUNITY): Payer: Self-pay

## 2018-02-25 ENCOUNTER — Emergency Department (HOSPITAL_COMMUNITY)
Admission: EM | Admit: 2018-02-25 | Discharge: 2018-02-25 | Disposition: A | Discharge status: Home / Self Care | Payer: Medicare Other | Attending: Emergency Medicine | Admitting: Emergency Medicine

## 2018-02-25 ENCOUNTER — Other Ambulatory Visit: Payer: Self-pay

## 2018-02-25 DIAGNOSIS — I2510 Atherosclerotic heart disease of native coronary artery without angina pectoris: Secondary | ICD-10-CM | POA: Diagnosis not present

## 2018-02-25 DIAGNOSIS — I10 Essential (primary) hypertension: Secondary | ICD-10-CM | POA: Diagnosis not present

## 2018-02-25 DIAGNOSIS — Z951 Presence of aortocoronary bypass graft: Secondary | ICD-10-CM | POA: Diagnosis not present

## 2018-02-25 DIAGNOSIS — Z7901 Long term (current) use of anticoagulants: Secondary | ICD-10-CM | POA: Insufficient documentation

## 2018-02-25 DIAGNOSIS — I739 Peripheral vascular disease, unspecified: Secondary | ICD-10-CM | POA: Insufficient documentation

## 2018-02-25 DIAGNOSIS — Z79899 Other long term (current) drug therapy: Secondary | ICD-10-CM | POA: Diagnosis not present

## 2018-02-25 DIAGNOSIS — R51 Headache: Secondary | ICD-10-CM | POA: Insufficient documentation

## 2018-02-25 MED ORDER — CYCLOBENZAPRINE HCL 10 MG PO TABS: 10.0000 mg | ORAL_TABLET | Freq: Two times a day (BID) | ORAL | 0 refills | Status: DC | PRN
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
## 2018-02-25 NOTE — ED Notes (Signed)
Patient verbalizes understanding of discharge instructions. Opportunity for questioning and answers were provided. Armband removed by staff, pt discharged from ED ambulatory with family.  

## 2018-02-25 NOTE — ED Notes (Signed)
ED Provider at bedside. 

## 2018-03-31 ENCOUNTER — Other Ambulatory Visit: Payer: Self-pay | Admitting: Cardiology

## 2018-05-08 ENCOUNTER — Other Ambulatory Visit: Payer: Self-pay | Admitting: Cardiology

## 2018-05-25 ENCOUNTER — Other Ambulatory Visit: Payer: Self-pay | Admitting: Cardiology
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
## 2018-06-08 ENCOUNTER — Telehealth: Payer: Self-pay | Admitting: Physician Assistant

## 2018-06-08 NOTE — Telephone Encounter (Signed)
YOUR CARDIOLOGY TEAM HAS ARRANGED FOR AN E-VISIT FOR YOUR APPOINTMENT - PLEASE REVIEW IMPORTANT INFORMATION BELOW SEVERAL DAYS PRIOR TO YOUR APPOINTMENT  Due to the recent COVID-19 pandemic, we are transitioning in-person office visits to tele-medicine visits in an effort to decrease unnecessary exposure to our patients and staff. Medicare and most insurances are covering these visits without a copay needed. We also encourage you to sign up for MyChart if you have not already done so. You will need a smartphone if possible. For patients that do not have this, we can still complete the visit using a regular telephone but do prefer a smartphone to enable video when possible. You may have a close family member that lives with you that can help. If possible, we also ask that you have a blood pressure cuff and scale at home to measure your blood pressure, heart rate and weight prior to your scheduled appointment. Patients with clinical needs that need an in-person evaluation and testing will still be able to come to the office if absolutely necessary. If you have any questions, feel free to call our office.     2-3 DAYS BEFORE YOUR APPOINTMENT  You will receive a telephone call from one of our [facility] team members - your caller ID may say "Unknown caller." If this is a video visit, we will confirm that you have been able to download the WebEx app. We will remind you check your blood pressure, heart rate and weight prior to your scheduled appointment. If you have an Apple Watch or Kardia, please upload any pertinent ECG strips the day before or morning of your appointment to MyChart. Our staff will also make sure you have reviewed the consent and agree to move forward with your scheduled tele-health visit.     THE DAY OF YOUR APPOINTMENT  Approximately 15 minutes prior to your scheduled appointment, you will receive a telephone call from one of HeartCare team - your caller ID may say "Unknown caller."   Our staff will confirm medications, vital signs for the day and any symptoms you may be experiencing. Please have this information available prior to the time of visit start. It may also be helpful for you to have a pad of paper and pen handy for any instructions given during your visit. They will also walk you through joining the WebEx smartphone meeting if this is a video visit.    CONSENT FOR TELE-HEALTH VISIT - PLEASE REVIEW  I hereby voluntarily request, consent and authorize [facility] and its employed or contracted physicians, physician assistants, nurse practitioners or other licensed health care professionals (the Practitioner), to provide me with telemedicine health care services (the "Services") as deemed necessary by the treating Practitioner. I acknowledge and consent to receive the Services by the Practitioner via telemedicine. I understand that the telemedicine visit will involve communicating with the Practitioner through live audiovisual communication technology and the disclosure of certain medical information by electronic transmission. I acknowledge that I have been given the opportunity to request an in-person assessment or other available alternative prior to the telemedicine visit and am voluntarily participating in the telemedicine visit.  I understand that I have the right to withhold or withdraw my consent to the use of telemedicine in the course of my care at any time, without affecting my right to future care or treatment, and that the Practitioner or I may terminate the telemedicine visit at any time. I understand that I have the right to inspect all information obtained   and/or recorded in the course of the telemedicine visit and may receive copies of available information for a reasonable fee.  I understand that some of the potential risks of receiving the Services via telemedicine include:  . Delay or interruption in medical evaluation due to technological equipment  failure or disruption; . Information transmitted may not be sufficient (e.g. poor resolution of images) to allow for appropriate medical decision making by the Practitioner; and/or  . In rare instances, security protocols could fail, causing a breach of personal health information.  Furthermore, I acknowledge that it is my responsibility to provide information about my medical history, conditions and care that is complete and accurate to the best of my ability. I acknowledge that Practitioner's advice, recommendations, and/or decision may be based on factors not within their control, such as incomplete or inaccurate data provided by me or distortions of diagnostic images or specimens that may result from electronic transmissions. I understand that the practice of medicine is not an exact science and that Practitioner makes no warranties or guarantees regarding treatment outcomes. I acknowledge that I will receive a copy of this consent concurrently upon execution via email to the email address I last provided but may also request a printed copy by calling the office of [facility].    I understand that my insurance will be billed for this visit.   I have read or had this consent read to me. . I understand the contents of this consent, which adequately explains the benefits and risks of the Services being provided via telemedicine.  . I have been provided ample opportunity to ask questions regarding this consent and the Services and have had my questions answered to my satisfaction. . I give my informed consent for the services to be provided through the use of telemedicine in my medical care  By participating in this telemedicine visit I agree to the above.  

## 2018-06-08 NOTE — Telephone Encounter (Signed)
LM for pt to call back to set up for Webex visit with Theodore Demark, PA on 06/14/18 at 8 AM.

## 2018-06-08 NOTE — Telephone Encounter (Signed)
Patient returned call

## 2018-06-08 NOTE — Telephone Encounter (Signed)
error 

## 2018-06-09 LAB — LIPID PANEL
CHOL/HDL RATIO: 3.6 ratio (ref 0.0–4.4)
CHOLESTEROL TOTAL: 199 mg/dL (ref 100–199)
HDL: 55 mg/dL (ref >39)
LDL CALC: 97 mg/dL (ref 0–99)
Triglycerides: 234 mg/dL — ABNORMAL HIGH (ref 0–149)
VLDL Cholesterol Cal: 47 mg/dL — ABNORMAL HIGH (ref 5–40)

## 2018-06-09 LAB — HEPATIC FUNCTION PANEL
ALK PHOS: 61 IU/L (ref 39–117)
ALT: 14 IU/L (ref 0–32)
AST: 14 IU/L (ref 0–40)
Albumin: 4.7 g/dL (ref 3.7–4.7)
BILIRUBIN TOTAL: 0.5 mg/dL (ref 0.0–1.2)
Bilirubin, Direct: 0.15 mg/dL (ref 0.00–0.40)
TOTAL PROTEIN: 6.5 g/dL (ref 6.0–8.5)

## 2018-06-13 ENCOUNTER — Other Ambulatory Visit: Payer: Self-pay

## 2018-06-14 ENCOUNTER — Other Ambulatory Visit: Payer: Self-pay

## 2018-06-14 ENCOUNTER — Encounter: Payer: Self-pay | Admitting: Physician Assistant

## 2018-06-14 ENCOUNTER — Telehealth (INDEPENDENT_AMBULATORY_CARE_PROVIDER_SITE_OTHER): Payer: Medicare Other | Admitting: Physician Assistant

## 2018-06-14 ENCOUNTER — Telehealth: Payer: Self-pay | Admitting: Physician Assistant

## 2018-06-14 VITALS — BP 141/78 | HR 70 | Wt 159.0 lb

## 2018-06-14 DIAGNOSIS — R600 Localized edema: Secondary | ICD-10-CM

## 2018-06-14 DIAGNOSIS — E785 Hyperlipidemia, unspecified: Secondary | ICD-10-CM

## 2018-06-14 DIAGNOSIS — I10 Essential (primary) hypertension: Secondary | ICD-10-CM

## 2018-06-14 DIAGNOSIS — I25810 Atherosclerosis of coronary artery bypass graft(s) without angina pectoris: Secondary | ICD-10-CM

## 2018-06-14 MED ORDER — ROSUVASTATIN CALCIUM 10 MG PO TABS: 20.0000 mg | ORAL_TABLET | Freq: Every day | ORAL | 3 refills | Status: DC

## 2018-06-14 MED ORDER — ROSUVASTATIN CALCIUM 20 MG PO TABS: 20.0000 mg | ORAL_TABLET | Freq: Every day | ORAL | 1 refills | Status: DC

## 2018-06-14 NOTE — Patient Instructions (Signed)
Continue daily weights and low-sodium diet.  Great activity level, continue this. Try to get aerobic exercise at least 5 days a week. The easiest way to do this is walking, 30" at a time. OK to do outside, maintain social distance during COVID pandemic.  Increase your Crestor to 20 mg daily. OK to start with 20 mg every other day to make sure you tolerate it.   Get fasting labs checked in 3 months or so.   Follow up with Dr Herbie Baltimore in October as scheduled, there is a recall in our system. If you do not hear from [place] by Sept 1st, please call the office.

## 2018-06-14 NOTE — Telephone Encounter (Signed)
Patient calling back- telehealth visit this morning with Bjorn Loser.

## 2018-06-14 NOTE — Telephone Encounter (Signed)
New Message    Pt is calling back to speak with Ladona Ridgel  Please call

## 2018-06-14 NOTE — Telephone Encounter (Signed)
Spoke to pt. Gave Patient instructions from visit with Theodore Demark, PA this morning and informed that she will be able to access her AVS via MyChart. Also informed that I am having AVS and lab slips mailed to her as well.   Reviewed non-cardiac meds per Bjorn Loser. Pt verbalized understanding and had no questions.

## 2018-06-14 NOTE — Addendum Note (Signed)
Addended by: Evans Lance on: 06/14/2018 09:21 AM   Modules accepted: Orders

## 2018-06-16 ENCOUNTER — Other Ambulatory Visit: Payer: Self-pay

## 2018-06-16 DIAGNOSIS — E785 Hyperlipidemia, unspecified: Secondary | ICD-10-CM

## 2018-06-16 MED ORDER — ROSUVASTATIN CALCIUM 20 MG PO TABS: 20.0000 mg | ORAL_TABLET | Freq: Every day | ORAL | 1 refills | Status: DC

## 2018-06-20 ENCOUNTER — Telehealth: Payer: Self-pay | Admitting: Physician Assistant

## 2018-06-20 NOTE — Telephone Encounter (Signed)
Per ov 06-14-2018 4. Hyper lipidemia, goal LDL < 70 - not at goal w/ LDL 97 - increase Crestor to 20 mg qd, ok to start w/ qod and see how tolerated - recheck labs before next f/u visit.   Pt states that she did not need the 10mg .

## 2018-06-20 NOTE — Telephone Encounter (Signed)
New Message:   Pt said 2 prescriptions were send in for her Rosuvastatin. . One for 10 mg and 20 mg. She was changed to 20 mg on 06-14-18 by Theodore Demark.  She did not need the 10 mg. She needs you to call [facility] to straighten this out please.

## 2018-06-20 NOTE — Telephone Encounter (Signed)
Spoke with lynn at [facility] (12 min on call-after 3x being disconnected) she states the 20mg  was delivered on 06-16-2018. Left detailed message for pt to cb with any further questions

## 2018-07-04 ENCOUNTER — Other Ambulatory Visit: Payer: Self-pay | Admitting: Physician Assistant

## 2018-07-04 NOTE — Telephone Encounter (Signed)
This is Dr. Harding's pt. °

## 2018-07-04 NOTE — Telephone Encounter (Signed)
Felodipine 2.5 mg refilled.

## 2018-08-22 ENCOUNTER — Other Ambulatory Visit: Payer: Self-pay | Admitting: Family Medicine

## 2018-08-22 DIAGNOSIS — Z1231 Encounter for screening mammogram for malignant neoplasm of breast: Secondary | ICD-10-CM

## 2018-10-05 ENCOUNTER — Ambulatory Visit
Admission: RE | Admit: 2018-10-05 | Discharge: 2018-10-05 | Disposition: A | Discharge status: Home / Self Care | Payer: Medicare Other | Source: Ambulatory Visit | Attending: Family Medicine | Admitting: Family Medicine

## 2018-10-05 ENCOUNTER — Other Ambulatory Visit: Payer: Self-pay

## 2018-10-05 DIAGNOSIS — Z1231 Encounter for screening mammogram for malignant neoplasm of breast: Secondary | ICD-10-CM

## 2018-11-20 ENCOUNTER — Other Ambulatory Visit: Payer: Self-pay | Admitting: Physician Assistant

## 2018-11-20 DIAGNOSIS — E785 Hyperlipidemia, unspecified: Secondary | ICD-10-CM

## 2018-11-21 ENCOUNTER — Other Ambulatory Visit: Payer: Self-pay | Admitting: Cardiology

## 2018-12-12 LAB — LIPID PANEL
Chol/HDL Ratio: 3.2 ratio (ref 0.0–4.4)
Cholesterol, Total: 155 mg/dL (ref 100–199)
HDL: 49 mg/dL (ref >39)
LDL Chol Calc (NIH): 80 mg/dL (ref 0–99)
Triglycerides: 152 mg/dL — ABNORMAL HIGH (ref 0–149)
VLDL Cholesterol Cal: 26 mg/dL (ref 5–40)

## 2018-12-12 LAB — COMPREHENSIVE METABOLIC PANEL
ALT: 9 IU/L (ref 0–32)
AST: 14 IU/L (ref 0–40)
Albumin/Globulin Ratio: 2 (ref 1.2–2.2)
Albumin: 4.4 g/dL (ref 3.7–4.7)
Alkaline Phosphatase: 71 IU/L (ref 39–117)
BUN/Creatinine Ratio: 28 (ref 12–28)
BUN: 21 mg/dL (ref 8–27)
Bilirubin Total: 0.4 mg/dL (ref 0.0–1.2)
CO2: 25 mmol/L (ref 20–29)
Calcium: 9.4 mg/dL (ref 8.7–10.3)
Chloride: 104 mmol/L (ref 96–106)
Creatinine, Ser: 0.76 mg/dL (ref 0.57–1.00)
GFR calc Af Amer: 90 mL/min/{1.73_m2} (ref >59)
GFR calc non Af Amer: 78 mL/min/{1.73_m2} (ref >59)
Globulin, Total: 2.2 g/dL (ref 1.5–4.5)
Glucose: 100 mg/dL — ABNORMAL HIGH (ref 65–99)
Potassium: 4.7 mmol/L (ref 3.5–5.2)
Sodium: 142 mmol/L (ref 134–144)
Total Protein: 6.6 g/dL (ref 6.0–8.5)

## 2018-12-20 ENCOUNTER — Ambulatory Visit (INDEPENDENT_AMBULATORY_CARE_PROVIDER_SITE_OTHER): Payer: Medicare Other | Admitting: Cardiology

## 2018-12-20 ENCOUNTER — Other Ambulatory Visit: Payer: Self-pay

## 2018-12-20 ENCOUNTER — Encounter: Payer: Self-pay | Admitting: Cardiology

## 2018-12-20 VITALS — BP 136/70 | HR 68 | Temp 97.0°F | Ht 63.0 in | Wt 168.0 lb

## 2018-12-20 DIAGNOSIS — R600 Localized edema: Secondary | ICD-10-CM

## 2018-12-20 DIAGNOSIS — I25810 Atherosclerosis of coronary artery bypass graft(s) without angina pectoris: Secondary | ICD-10-CM | POA: Diagnosis not present

## 2018-12-20 DIAGNOSIS — I2510 Atherosclerotic heart disease of native coronary artery without angina pectoris: Secondary | ICD-10-CM

## 2018-12-20 DIAGNOSIS — E785 Hyperlipidemia, unspecified: Secondary | ICD-10-CM | POA: Diagnosis not present

## 2018-12-20 DIAGNOSIS — I10 Essential (primary) hypertension: Secondary | ICD-10-CM

## 2018-12-20 MED ORDER — ROSUVASTATIN CALCIUM 20 MG PO TABS: ORAL_TABLET | ORAL | 3 refills | Status: DC

## 2018-12-20 NOTE — Patient Instructions (Addendum)
Medication Instructions:  - start  Alternating Rosuvastatin 20 mg one day and 40 mg the other ( 2 tablets of 20 mg  If you need a refill on your cardiac medications before your next appointment, please call your pharmacy.   Lab work: Lipid - both  fasting in April 2021 and Sept 2021 CMP  If you have labs (blood work) drawn today and your tests are completely normal, you will receive your results only by: Marland Kitchen MyChart Message (if you have MyChart) OR . A paper copy in the mail If you have any lab test that is abnormal or we need to change your treatment, we will call you to review the results.  Testing/Procedures:  Will be schedule in Sept 2021  At [place] has requested that you have a lexiscan myoview. For further information please visit [email or web address]. Please follow instruction sheet, as given.    Follow-Up: At [facility], you and your health needs are our priority.  As part of our continuing mission to provide you with exceptional heart care, we have created designated Provider Care Teams.  These Care Teams include your primary Cardiologist (physician) and Advanced Practice Providers (APPs -  Physician Assistants and Nurse Practitioners) who all work together to provide you with the care you need, when you need it. . You will need a follow up appointment in  12 months- Oct 2021.  Please call our office 2 months in advance to schedule this appointment.  You may see Glenetta Hew, MD or one of the following Advanced Practice Providers on your designated Care Team:   . Rosaria Ferries, PA-C . Jory Sims, DNP, ANP  Any Other Special Instructions Will Be Listed Below (If Applicable).

## 2018-12-20 NOTE — Progress Notes (Signed)
PCP: Clovis Riley, L.August Saucer, MD  Clinic Note: Chief Complaint  Patient presents with  . Follow-up    1 year.  . Edema    Left leg and feet.  . Coronary Artery Disease    History of CABG.  Occluded SVG-D1     HPI:   ABAGAEL KRAMM is a 73 y.o. female with a distant history of CAD-CABG (known occluded SVG-D1) who presents today for annual f/u  She has a distant history of LM CAD --> CABG back in 2000;   Cath 2008. w/ occluded SVG-D1,   Last Myoview was in 2016: Low risk.  normal EF and no ischemia, infarct.  Likely breast attenuation/apical thinning  CHIARA COLTRIN was last seen on 12/20/17 -- stable  Recent Hospitalizations: None  Reviewed  CV studies:   The following studies were reviewed today: (if available, images/films reviewed: From Epic Chart or Care Everywhere) . Carotid Dopplers October 2019: Bilateral ICA 1-39%.  R ICA levels have normalized.  Bilateral vertebral and subclavian artery flow.   Interval History:   Luzelena returns today pretty much doing well.  She has been staying active throughout the COVID-19 restriction.  And did not gain significant weight.  She has been out and about doing gardening and other yard work.  Has not been sedentary.  Stable edema - goes down with elevation.  Support stockings once cooler weather - somewhat helpful.  She is has a hard time wearing the support stockings in the summertime.  Cardiovascular review of symptoms (summary): no chest pain or dyspnea on exertion positive for - edema negative for - irregular heartbeat, orthopnea, palpitations, paroxysmal nocturnal dyspnea, rapid heart rate, shortness of breath or syncope/near syncope, TIA/amaurosis fugax, claudication.  Uses CPAP regularly - but nasal pillows shift every now & then & wake her up.  The patient does not have symptoms concerning for COVID-19 infection (fever, chills, cough, or new shortness of breath).  The patient is practicing social distancing. ++ Masking.  +  Groceries/shopping. Retired- Work   REVIEWED OF SYSTEMS   ROS: A comprehensive was performed. Review of Systems  Constitutional: Negative for malaise/fatigue and weight loss.  HENT: Negative for congestion and nosebleeds.   Respiratory: Negative for cough and shortness of breath.   Cardiovascular: Positive for leg swelling.  Gastrointestinal: Negative for blood in stool and melena.  Genitourinary: Negative for hematuria.  Musculoskeletal: Negative for joint pain (Knees still hurt a little bit).  Neurological: Negative for dizziness, weakness and headaches.  Endo/Heme/Allergies: Positive for environmental allergies (has cough/wheeze & rhinorrhea).  Psychiatric/Behavioral: Negative for depression and memory loss. The patient is not nervous/anxious and does not have insomnia.     I have reviewed and (if needed) personally updated the patient's problem list, medications, allergies, past medical and surgical history, social and family history.   PAST MEDICAL HISTORY   Past Medical History:  Diagnosis Date  . Bilateral lower extremity edema - mild, likely venous stasis 05/16/2013  . CAD (coronary artery disease) of bypass graft 2004/2008   Cath 2008: SVG-D1 Occluded with patent D1, LM 90%, Patent LIMA-LAD & SVG-OM1, patent Native RCA, EF ~45-50% & 2+ MR.; Myoview 2012: No ischemia or infarction, Normal EF; Echo 2013: Nl LV Fxn & normal valves   . Essential hypertension   . H/O unstable angina  2003   Cardiac cath with multivessel disease referred for CABG  . Hyperlipidemia LDL goal <70   . Hypertension, benign   . Left main coronary artery disease 2003  70% ostial left main, 100% Cx,; patent, normal RCA  . Leg edema, left 04/08/2015  . Medication management 01/08/2016  . OSA on CPAP   . 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 Laneta Simmers)     PAST SURGICAL HISTORY   Past Surgical History:  Procedure Laterality Date  . CARDIAC CATHETERIZATION  01/2002   70% left main,  100 % Cx @ OM; normal RCA  . CARDIAC CATHETERIZATION  2004, 2008   Occluded SVG-D1 with antegrade native D1 flow; LM increased from 70-90% with patent LIMA and SVG-OM1; patent RCA ; EF of 45-50% with 2+ MR   . Carotid Duplex Ultrasound  09/2012   Less than 49% right carotid; relatively normal left carotid.  . CORONARY ARTERY BYPASS GRAFT  01/2002   Dr. Laneta Simmers: LIMA-LAD, SVG-OM 1, SVG-D1  . NM MYOVIEW LTD  2012; 11/2014   a. No evidence of ischemia, normal EF;; b. Hyperdynamic LV, EF 69%. Hypertensive response to exercise. ST depressions in V5 and V6. Small, moderate intensity, fixed defect in the distal anterior and apical wall consistent with soft tissue attenuation. LOW RISK.  Marland Kitchen TRANSTHORACIC ECHOCARDIOGRAM  2013   Normal LV function with normal valves.     MEDICATIONS/ALLERGIES   Current Meds  Medication Sig  . cholecalciferol (VITAMIN D) 1000 UNITS tablet Take 1,000 Units by mouth daily.  . clopidogrel (PLAVIX) 75 MG tablet TAKE 1 TABLET DAILY  . Coenzyme Q10-Vitamin E (QUNOL ULTRA COQ10 PO) Take 1 capsule by mouth daily.  . felodipine (PLENDIL) 2.5 MG 24 hr tablet TAKE 1 TABLET DAILY  . fexofenadine (ALLEGRA) 180 MG tablet Take 180 mg by mouth daily as needed for allergies or rhinitis.  . folic acid (FOLVITE) 400 MCG tablet Take 800 mcg by mouth daily.   . irbesartan-hydrochlorothiazide (AVALIDE) 300-12.5 MG per tablet Take 1 tablet by mouth daily.   . Magnesium 250 MG TABS Take 1 tablet by mouth daily.  . metoprolol succinate (TOPROL-XL) 100 MG 24 hr tablet TAKE 1 TABLET DAILY WITH OR IMMEDIATELY FOLLOWING A MEAL  . Multiple Vitamin (MULTIVITAMIN) capsule Take 1 capsule by mouth daily.  . Omega-3 Fatty Acids (FISH OIL) 1200 MG CAPS Take 3 capsules (3,600 mg total) by mouth daily.  . potassium chloride SA (K-DUR,KLOR-CON) 20 MEQ tablet Take 1 tablet (20 mEq total) by mouth daily.  . rosuvastatin (CRESTOR) 20 MG tablet Alternating every other day  taking 20 mg with 40 mg ( 2  tablets)  daily  . [DISCONTINUED] rosuvastatin (CRESTOR) 20 MG tablet TAKE 1 TABLET DAILY    Allergies  Allergen Reactions  . Etodolac Anaphylaxis    migraines  . Amlodipine     PATIENT NOT SURE THINKS LEG SWELLING. USE IN THE 1990'S  . Motrin [Ibuprofen] Swelling    Throat swells  . Statins     Myalgias   . Carvedilol Rash    SOCIAL HISTORY/FAMILY HISTORY   Social History   Tobacco Use  . Smoking status: Never Smoker  . Smokeless tobacco: Never Used  Substance Use Topics  . Alcohol use: No  . Drug use: No   Social History   Social History Narrative   73 year old married white woman, mother of 3, grandmother of 4.   Never smoked. Rare social alcohol.   She is a Hotel manager around the house, and does lots of the chart work.   Her husband is also a former patient Dr. Alanda Amass, and now follows up with Dr. Herbie Baltimore.  family history includes Other in her father and mother.   OBJCTIVE -PE, EKG, labs   Wt Readings from Last 3 Encounters:  12/20/18 168 lb (76.2 kg)  06/14/18 159 lb (72.1 kg)  02/25/18 165 lb (74.8 kg)    Physical Exam: BP 136/70 (BP Location: Left Arm, Patient Position: Sitting, Cuff Size: Normal)   Pulse 68   Temp (!) 97 F (36.1 C)   Ht  (1.6 m)   Wt 168 lb (76.2 kg)   BMI 29.76 kg/m  Physical Exam  Constitutional: She is oriented to person, place, and time. She appears well-developed and well-nourished. No distress.  Well-groomed.  Healthy-appearing  HENT:  Head: Normocephalic and atraumatic.  Neck: Normal range of motion. Neck supple. No hepatojugular reflux and no JVD present. Carotid bruit is not present.  Cardiovascular: Normal rate, regular rhythm and normal pulses.  No extrasystoles are present. Exam reveals no gallop and no friction rub.  Murmur (Barely audible soft HSM at apex.) heard. Pulmonary/Chest: Breath sounds normal. No respiratory distress. She has no wheezes. She has no rales.  Abdominal: Soft. Bowel sounds are  normal. She exhibits no distension. There is no abdominal tenderness. There is no rebound.  Musculoskeletal: Normal range of motion.        General: Edema (Trivial L> R LE) present.  Neurological: She is alert and oriented to person, place, and time.  Psychiatric: She has a normal mood and affect. Her behavior is normal. Judgment and thought content normal.  Vitals reviewed.   Murmur heard.  Decrescendo holosystolic murmur is present with a grade of 1/6 at the lower left sternal border and apex. Pulmonary/Chest: Effort normal and breath sounds normal. No respiratory distress. She has no wheezes. She has no rales.  Musculoskeletal: Normal range of motion. She exhibits edema (1-2+ left leg,ith trace right leg edema with mild varicose veins. -Is not wearing her support stockings today, but usually does wear them.).  Neurological: She is alert and oriented to person, place, and time. She exhibits normal muscle tone.  Skin: Skin is warm and dry. No rash noted. No erythema.   No significant venous stasis dermatitis noted.    Adult ECG Report  Rate: 68;  Rhythm: normal sinus rhythm and Left atrial enlargement.  Normal axis, intervals and durations.;   Narrative Interpretation: Stable  Recent Labs:   Lab Results  Component Value Date   CHOL 155 12/12/2018   HDL 49 12/12/2018   LDLCALC 80 12/12/2018   TRIG 152 (H) 12/12/2018   CHOLHDL 3.2 12/12/2018   Lab Results  Component Value Date   CREATININE 0.76 12/12/2018   BUN 21 12/12/2018   NA 142 12/12/2018   K 4.7 12/12/2018   CL 104 12/12/2018   CO2 25 12/12/2018    ASSESSMENT/PLAN    Problem List Items Addressed This Visit    Left main coronary artery disease - 90% (Chronic)    Had significant native coronary disease.  Now status post CABG.  No anginal symptoms.  Just some mild edema.  Is on combination ARB-HCTZ along with beta-blocker and calcium channel blocker for antianginal effect.  On moderate dose rosuvastatin with  borderline lipid control.  Continue current medications. Due for follow-up nuclear stress test, ordered prior to the next visit      Relevant Medications   rosuvastatin (CRESTOR) 20 MG tablet   Other Relevant Orders   EKG 12-Lead (Completed)   Lipid panel   Comprehensive metabolic panel   Lipid panel  Comprehensive metabolic panel   MYOCARDIAL PERFUSION IMAGING   Coronary artery disease involving coronary bypass graft - occluded SVG-D1 - Primary (Chronic)    Her other grafts are patent.  I suspect the diagonal was not that big vessel.  No angina symptoms now, which would argue that her other grafts are patent.  Nothing showed up on Myoview as far as any ischemia in 2016.  Plan: Continue metoprolol and felodipine along with ARB-HCTZ.  On stable dose of Crestor.  Is currently on Plavix without aspirin --as of now, okay to hold Plavix for procedures 5 to 7 days preop  Due for her routine surveillance Myoview prior to follow-up visit.      Relevant Medications   rosuvastatin (CRESTOR) 20 MG tablet   Other Relevant Orders   EKG 12-Lead (Completed)   Lipid panel   Comprehensive metabolic panel   Lipid panel   Comprehensive metabolic panel   MYOCARDIAL PERFUSION IMAGING   Essential hypertension (Chronic)    She was switched back to irbesartan-HCTZ.  Not really using that much Lasix for edema anymore.  Blood pressure looks well controlled.      Relevant Medications   rosuvastatin (CRESTOR) 20 MG tablet   Bilateral lower extremity edema - mild, likely venous stasis (Chronic)    She really did not get much benefit from the Lasix, therefore is not really using it now.  Is back on combination of ARB-HCTZ.  Some component of edema could be related to combination of ARB and felodipine as well. We discussed importance of foot elevation and support stockings.  Also lower leg exercises and walking.        Relevant Orders   Comprehensive metabolic panel   Comprehensive metabolic  panel   Hyperlipidemia LDL goal <70 (Chronic)    Her LDL went all the way up to 97 now that we have backed down 100 Crestor.  We have increased it back up to 20 and it still going the wrong direction.  For now would like to do alternating 20 mg 1 day and 40 the next.  Should have labs checked in roughly April 2021      Relevant Medications   rosuvastatin (CRESTOR) 20 MG tablet   Other Relevant Orders   Lipid panel   Comprehensive metabolic panel   Lipid panel   Comprehensive metabolic panel       COVID-19 Education: The signs and symptoms of COVID-19 were discussed with the patient and how to seek care for testing (follow up with PCP or arrange E-visit).   The importance of social distancing was discussed today.  I spent a total of 21minutes with the patient and chart review. >  50% of the time was spent in direct patient consultation.  Additional time spent with chart review (studies, outside notes, etc): 5 Total Time: 26 min   Current medicines are reviewed at length with the patient today.  (+/- concerns) none   Patient Instructions / Medication Changes & Studies & Tests Ordered   Patient Instructions  Medication Instructions:  - start  Alternating Rosuvastatin 20 mg one day and 40 mg the other ( 2 tablets of 20 mg  If you need a refill on your cardiac medications before your next appointment, please call your pharmacy.   Lab work: Lipid - both  fasting in April 2021 and Sept 2021 CMP  If you have labs (blood work) drawn today and your tests are completely normal, you will receive your results only by: Marland Kitchen. MyChart  Message (if you have MyChart) OR . A paper copy in the mail If you have any lab test that is abnormal or we need to change your treatment, we will call you to review the results.  Testing/Procedures:  Will be schedule in Sept 2021  At [facility] has requested that you have a lexiscan myoview. For further information please  visit [email or web address]. Please follow instruction sheet, as given.    Follow-Up: At [facility], you and your health needs are our priority.  As part of our continuing mission to provide you with exceptional heart care, we have created designated Provider Care Teams.  These Care Teams include your primary Cardiologist (physician) and Advanced Practice Providers (APPs -  Physician Assistants and Nurse Practitioners) who all work together to provide you with the care you need, when you need it. . You will need a follow up appointment in  12 months- Oct 2021.  Please call our office 2 months in advance to schedule this appointment.  You may see Glenetta Hew, MD or one of the following Advanced Practice Providers on your designated Care Team:   . Rosaria Ferries, PA-C . Jory Sims, DNP, ANP  Any Other Special Instructions Will Be Listed Below (If Applicable).    Studies Ordered:   Orders Placed This Encounter  Procedures  . Lipid panel  . Comprehensive metabolic panel  . Lipid panel  . Comprehensive metabolic panel  . MYOCARDIAL PERFUSION IMAGING  . EKG 12-Lead     Glenetta Hew, M.D., M.S. Interventional Cardiologist   Pager # [phone] Phone # [phone] [address] [place],  [address]   Thank you for choosing Heartcare at [facility]!!

## 2018-12-21 ENCOUNTER — Other Ambulatory Visit: Payer: Self-pay

## 2018-12-22 ENCOUNTER — Encounter: Payer: Self-pay | Admitting: Cardiology

## 2018-12-22 NOTE — Assessment & Plan Note (Signed)
She really did not get much benefit from the Lasix, therefore is not really using it now.  Is back on combination of ARB-HCTZ.  Some component of edema could be related to combination of ARB and felodipine as well. We discussed importance of foot elevation and support stockings.  Also lower leg exercises and walking.

## 2018-12-22 NOTE — Assessment & Plan Note (Signed)
Her other grafts are patent.  I suspect the diagonal was not that big vessel.  No angina symptoms now, which would argue that her other grafts are patent.  Nothing showed up on Myoview as far as any ischemia in 2016.  Plan: Continue metoprolol and felodipine along with ARB-HCTZ.  On stable dose of Crestor.  Is currently on Plavix without aspirin --as of now, okay to hold Plavix for procedures 5 to 7 days preop  Due for her routine surveillance Myoview prior to follow-up visit.

## 2018-12-22 NOTE — Assessment & Plan Note (Signed)
Had significant native coronary disease.  Now status post CABG.  No anginal symptoms.  Just some mild edema.  Is on combination ARB-HCTZ along with beta-blocker and calcium channel blocker for antianginal effect.  On moderate dose rosuvastatin with borderline lipid control.  Continue current medications. Due for follow-up nuclear stress test, ordered prior to the next visit

## 2018-12-22 NOTE — Assessment & Plan Note (Signed)
Her LDL went all the way up to 97 now that we have backed down 100 Crestor.  We have increased it back up to 20 and it still going the wrong direction.  For now would like to do alternating 20 mg 1 day and 40 the next.  Should have labs checked in roughly April 2021

## 2018-12-22 NOTE — Assessment & Plan Note (Signed)
She was switched back to irbesartan-HCTZ.  Not really using that much Lasix for edema anymore.  Blood pressure looks well controlled.

## 2018-12-26 ENCOUNTER — Telehealth: Payer: Self-pay | Admitting: Cardiology

## 2018-12-26 DIAGNOSIS — E785 Hyperlipidemia, unspecified: Secondary | ICD-10-CM

## 2018-12-26 MED ORDER — ROSUVASTATIN CALCIUM 40 MG PO TABS: ORAL_TABLET | ORAL | 3 refills | Status: DC

## 2018-12-26 NOTE — Telephone Encounter (Signed)
Spoke with patient and [facility], because of the quantity restrictions the rosuvastatin script changed to 40 mg tablet and the patient knows to alternate between 1 tablet and 1/2 tablet.

## 2018-12-26 NOTE — Telephone Encounter (Signed)
New message     Pt c/o medication issue:  1. Name of Medication: Rosuvastatin  2. How are you currently taking this medication (dosage and times per day)? Alternating every other day taking 20 mg with 40 mg ( 2 tablets) daily  3. Are you having a reaction (difficulty breathing--STAT)? No  4. What is your medication issue? Patient states the pharmacy needs to speak to a nurse here at the office and would like someone to contact them so they could get the medication.  Please call patient back.

## 2018-12-29 ENCOUNTER — Other Ambulatory Visit: Payer: Self-pay

## 2018-12-29 DIAGNOSIS — E785 Hyperlipidemia, unspecified: Secondary | ICD-10-CM

## 2018-12-29 MED ORDER — ROSUVASTATIN CALCIUM 40 MG PO TABS: ORAL_TABLET | ORAL | 3 refills | Status: DC

## 2019-01-01 ENCOUNTER — Other Ambulatory Visit: Payer: Self-pay

## 2019-01-01 DIAGNOSIS — E785 Hyperlipidemia, unspecified: Secondary | ICD-10-CM

## 2019-01-01 MED ORDER — ROSUVASTATIN CALCIUM 40 MG PO TABS: ORAL_TABLET | ORAL | 3 refills | Status: DC

## 2019-03-26 ENCOUNTER — Other Ambulatory Visit: Payer: Self-pay | Admitting: Cardiology

## 2019-03-27 NOTE — Telephone Encounter (Signed)
Rx(s) sent to pharmacy electronically.  

## 2019-04-04 ENCOUNTER — Other Ambulatory Visit: Payer: Self-pay | Admitting: Cardiology

## 2019-04-04 MED ORDER — POTASSIUM CHLORIDE CRYS ER 20 MEQ PO TBCR: 20.0000 meq | EXTENDED_RELEASE_TABLET | Freq: Every day | ORAL | 2 refills | Status: DC

## 2019-04-04 NOTE — Telephone Encounter (Signed)
*  STAT* If patient is at the pharmacy, call can be transferred to refill team.   1. Which medications need to be refilled? (please list name of each medication and dose if known)  potassium chloride SA (K-DUR,KLOR-CON) 20 MEQ tablet  2. Which pharmacy/location (including street and city if local pharmacy) is medication to be sent to?  [id number] - [place], [place] - [address]  3. Do they need a 30 day or 90 day supply? 90 day

## 2019-04-16 ENCOUNTER — Ambulatory Visit: Payer: Medicare Other | Attending: Internal Medicine

## 2019-04-16 ENCOUNTER — Other Ambulatory Visit: Payer: Self-pay

## 2019-04-16 DIAGNOSIS — Z23 Encounter for immunization: Secondary | ICD-10-CM | POA: Insufficient documentation

## 2019-04-16 NOTE — Progress Notes (Signed)
   Covid-19 Vaccination Clinic  Name:  [name]    MRN: [id number] DOB: 15-Nov-1945  04/16/2019  [name] was observed post Covid-19 immunization for 15 minutes without incidence. She was provided with Vaccine Information Sheet and instruction to access the V-Safe system.   [name] was instructed to call 911 with any severe reactions post vaccine: Marland Kitchen Difficulty breathing  . Swelling of your face and throat  . A fast heartbeat  . A bad rash all over your body  . Dizziness and weakness

## 2019-04-27 ENCOUNTER — Other Ambulatory Visit: Payer: Self-pay | Admitting: Cardiology

## 2019-04-27 DIAGNOSIS — E785 Hyperlipidemia, unspecified: Secondary | ICD-10-CM

## 2019-04-27 MED ORDER — CLOPIDOGREL BISULFATE 75 MG PO TABS: 75.0000 mg | ORAL_TABLET | Freq: Every day | ORAL | 2 refills | Status: DC

## 2019-04-27 MED ORDER — METOPROLOL SUCCINATE ER 100 MG PO TB24: ORAL_TABLET | ORAL | 2 refills | Status: DC

## 2019-04-27 MED ORDER — FELODIPINE ER 2.5 MG PO TB24: 2.5000 mg | ORAL_TABLET | Freq: Every day | ORAL | 2 refills | Status: DC

## 2019-04-27 MED ORDER — ROSUVASTATIN CALCIUM 40 MG PO TABS: ORAL_TABLET | ORAL | 2 refills | Status: DC

## 2019-04-27 NOTE — Telephone Encounter (Signed)
New Message   *STAT* If patient is at the pharmacy, call can be transferred to refill team.   1. Which medications need to be refilled? (please list name of each medication and dose if known)  metoprolol succinate (TOPROL-XL) 100 MG 24 hr tablet   felodipine (PLENDIL) 2.5 MG 24 hr tablet clopidogrel (PLAVIX) 75 MG tablet rosuvastatin (CRESTOR) 40 MG tablet  2. Which pharmacy/location (including street and city if local pharmacy) is medication to be sent to? [id number] - [place], [place] - [address]  3. Do they need a 30 day or 90 day supply? 90 day

## 2019-05-04 ENCOUNTER — Ambulatory Visit: Payer: Medicare Other

## 2019-05-09 ENCOUNTER — Ambulatory Visit: Payer: Medicare Other | Attending: Internal Medicine

## 2019-05-09 DIAGNOSIS — Z23 Encounter for immunization: Secondary | ICD-10-CM

## 2019-05-09 NOTE — Progress Notes (Signed)
   Covid-19 Vaccination Clinic  Name:  [name]    MRN: [id number] DOB: Jul 09, 1945  05/09/2019  [name] was observed post Covid-19 immunization for 15 minutes without incident. She was provided with Vaccine Information Sheet and instruction to access the V-Safe system.   [name] was instructed to call 911 with any severe reactions post vaccine: Marland Kitchen Difficulty breathing  . Swelling of face and throat  . A fast heartbeat  . A bad rash all over body  . Dizziness and weakness   Immunizations Administered    Name Date Dose VIS Date Route   Pfizer COVID-19 Vaccine 05/09/2019  9:30 AM 0.3 mL 02/16/2019 Intramuscular   Manufacturer: [employer], [employer]   Lot: [id number]   NDC: [address]-4

## 2019-08-22 ENCOUNTER — Other Ambulatory Visit: Payer: Self-pay | Admitting: Family Medicine

## 2019-08-22 DIAGNOSIS — Z1231 Encounter for screening mammogram for malignant neoplasm of breast: Secondary | ICD-10-CM

## 2019-10-08 ENCOUNTER — Ambulatory Visit
Admission: RE | Admit: 2019-10-08 | Discharge: 2019-10-08 | Disposition: A | Discharge status: Home / Self Care | Payer: Medicare Other | Source: Ambulatory Visit | Attending: Family Medicine | Admitting: Family Medicine

## 2019-10-08 ENCOUNTER — Other Ambulatory Visit: Payer: Self-pay

## 2019-10-08 DIAGNOSIS — Z1231 Encounter for screening mammogram for malignant neoplasm of breast: Secondary | ICD-10-CM

## 2019-10-23 ENCOUNTER — Other Ambulatory Visit: Payer: Self-pay | Admitting: Cardiology

## 2019-10-24 NOTE — Telephone Encounter (Signed)
Rx has been sent to the pharmacy electronically. ° °

## 2019-11-15 ENCOUNTER — Telehealth (HOSPITAL_COMMUNITY): Payer: Self-pay

## 2019-11-15 NOTE — Telephone Encounter (Signed)
Encounter complete. 

## 2019-11-20 ENCOUNTER — Other Ambulatory Visit: Payer: Self-pay

## 2019-11-20 ENCOUNTER — Ambulatory Visit (HOSPITAL_COMMUNITY)
Admission: RE | Admit: 2019-11-20 | Discharge: 2019-11-20 | Disposition: A | Discharge status: Home / Self Care | Payer: Medicare Other | Source: Ambulatory Visit | Attending: Internal Medicine | Admitting: Internal Medicine

## 2019-11-20 ENCOUNTER — Other Ambulatory Visit: Payer: Medicare Other

## 2019-11-20 DIAGNOSIS — I2510 Atherosclerotic heart disease of native coronary artery without angina pectoris: Secondary | ICD-10-CM | POA: Insufficient documentation

## 2019-11-20 DIAGNOSIS — I25810 Atherosclerosis of coronary artery bypass graft(s) without angina pectoris: Secondary | ICD-10-CM | POA: Diagnosis present

## 2019-11-20 LAB — MYOCARDIAL PERFUSION IMAGING
LV dias vol: 76 mL (ref 46–106)
LV sys vol: 25 mL
Peak HR: 98 {beats}/min
Rest HR: 57 {beats}/min
SDS: 2
SRS: 3
SSS: 5
TID: 0.88

## 2019-11-20 MED ORDER — TECHNETIUM TC 99M TETROFOSMIN IV KIT
10.8000 | PACK | Freq: Once | INTRAVENOUS | Status: AC | PRN
  Administered 2019-11-20: 10.8 via INTRAVENOUS
  Filled 2019-11-20: qty 11

## 2019-11-20 MED ORDER — TECHNETIUM TC 99M TETROFOSMIN IV KIT
30.2000 | PACK | Freq: Once | INTRAVENOUS | Status: AC | PRN
  Administered 2019-11-20: 30.2 via INTRAVENOUS
  Filled 2019-11-20: qty 31

## 2019-11-20 MED ORDER — REGADENOSON 0.4 MG/5ML IV SOLN
0.4000 mg | Freq: Once | INTRAVENOUS | Status: AC
  Administered 2019-11-20: 0.4 mg via INTRAVENOUS

## 2019-11-26 HISTORY — PX: NM MYOVIEW LTD: HXRAD82

## 2019-12-20 ENCOUNTER — Encounter: Payer: Self-pay | Admitting: Cardiology

## 2019-12-20 ENCOUNTER — Ambulatory Visit (INDEPENDENT_AMBULATORY_CARE_PROVIDER_SITE_OTHER): Payer: Medicare Other | Admitting: Cardiology

## 2019-12-20 ENCOUNTER — Other Ambulatory Visit: Payer: Self-pay

## 2019-12-20 VITALS — BP 146/70 | HR 72 | Ht 63.0 in | Wt 166.2 lb

## 2019-12-20 DIAGNOSIS — I10 Essential (primary) hypertension: Secondary | ICD-10-CM

## 2019-12-20 DIAGNOSIS — I25810 Atherosclerosis of coronary artery bypass graft(s) without angina pectoris: Secondary | ICD-10-CM

## 2019-12-20 DIAGNOSIS — I2510 Atherosclerotic heart disease of native coronary artery without angina pectoris: Secondary | ICD-10-CM

## 2019-12-20 DIAGNOSIS — E785 Hyperlipidemia, unspecified: Secondary | ICD-10-CM

## 2019-12-20 DIAGNOSIS — R600 Localized edema: Secondary | ICD-10-CM

## 2019-12-20 DIAGNOSIS — Z951 Presence of aortocoronary bypass graft: Secondary | ICD-10-CM

## 2019-12-20 DIAGNOSIS — Z79899 Other long term (current) drug therapy: Secondary | ICD-10-CM

## 2019-12-20 MED ORDER — ASPIRIN EC 81 MG PO TBEC: 81.0000 mg | DELAYED_RELEASE_TABLET | Freq: Every day | ORAL | 11 refills | Status: DC

## 2019-12-20 NOTE — Patient Instructions (Signed)
Medication Instructions:   Stop taking clopidogrel  And Start taking Enteric coated aspirin 81 mg one tablet daily   *If you need a refill on your cardiac medications before your next appointment, please call your pharmacy*   Lab Work: cmp Lipid  If you have labs (blood work) drawn today and your tests are completely normal, you will receive your results only by: Marland Kitchen MyChart Message (if you have MyChart) OR . A paper copy in the mail If you have any lab test that is abnormal or we need to change your treatment, we will call you to review the results.   Testing/Procedures: Not needed   Follow-Up: At [facility], you and your health needs are our priority.  As part of our continuing mission to provide you with exceptional heart care, we have created designated Provider Care Teams.  These Care Teams include your primary Cardiologist (physician) and Advanced Practice Providers (APPs -  Physician Assistants and Nurse Practitioners) who all work together to provide you with the care you need, when you need it.  We recommend signing up for the patient portal called "MyChart".  Sign up information is provided on this After Visit Summary.  MyChart is used to connect with patients for Virtual Visits (Telemedicine).  Patients are able to view lab/test results, encounter notes, upcoming appointments, etc.  Non-urgent messages can be sent to your provider as well.   To learn more about what you can do with MyChart, go to [email or web address].    Your next appointment:   12 month(s)  The format for your next appointment:   In Person  Provider:   Bryan Lemma, MD   Other Instructions

## 2019-12-20 NOTE — Progress Notes (Signed)
Primary Care Provider: Clovis Riley, L.August Saucer, MD Cardiologist: Bryan Lemma, MD Electrophysiologist: None  Clinic Note: No chief complaint on file.   HPI:    [name] is a 74 y.o. female with a history of LM CAD-CABG (known occlusion of SVG-D1) who presents today for annual follow-up.   Cardiac Cath 2003: 70% LM, 100% LCx and OM.  Normal RCA. => Referred for CABG (LIMA-LAD, SVG-OM1, SVG-D1.  Last cath 2008: LM 70-90%.  Occluded SVG-D1.  LIMA-LAD and SVG-OM1.  Antegrade flow noted in native D1.  EF 45 to 50%.  2+ MR.  Myoview 11/26/2019: EF 65-70%.  No R WMA.  No ischemia or infarction.  [name] was last seen on December 20, 2018.  She indicated she had been staying home a lot during the COVID-19 lockdown.  Thankfully, she did not gain significant weight.  She was out doing gardening and other yard work.  She noted stable edema that goes down with foot elevation.  Left is worse than right.  She only wears support stockings to the cooler weather.  Cannot wear them in the summertime because of too high.  Uses CPAP regularly with nasal pillows.  Unfortunately they shift every now and then making her wake up.  Knees hurt.  Allergies bother her.  Recent Hospitalizations: None  Reviewed  CV studies:    The following studies were reviewed today: (if available, images/films reviewed: From Epic Chart or Care Everywhere)  Myoview Sept 14, 2021:  Normal stress nuclear study with no ischemia or infarction.  Gated ejection fraction 68% with normal wall motion.   Interval History:   [name] returns today overall feeling quite well.  She is really not noticing any major issues.  No chest pain or pressure.  She probably has not been as active as she had been before because of the COVID-19 restrictions.  She is still doing her gardening and yard work though and has no major cardiac symptoms.  Chronic left greater than right leg swelling --> usually controlled with foot elevation and  support socks that she only wears during cooler weather. Still using CPAP.  CV Review of Symptoms (Summary): no chest pain or dyspnea on exertion positive for - edema and Occasional skipped beats.  Occasional issues with CPAP. negative for - orthopnea, paroxysmal nocturnal dyspnea, rapid heart rate, shortness of breath or Syncope/near syncope, TIA/amaurosis fugax, claudication  The patient does not have symptoms concerning for COVID-19 infection (fever, chills, cough, or new shortness of breath).   REVIEWED OF SYSTEMS   Review of Systems  Constitutional: Negative for diaphoresis and malaise/fatigue.  HENT: Negative for congestion and nosebleeds.   Respiratory: Negative for cough and shortness of breath.   Cardiovascular: Positive for leg swelling.  Gastrointestinal: Negative for abdominal pain, blood in stool and melena.  Genitourinary: Negative for hematuria.  Musculoskeletal: Positive for joint pain. Negative for back pain, falls and neck pain.  Neurological: Negative for dizziness and focal weakness.  Psychiatric/Behavioral: Negative for depression and memory loss.   I have reviewed and (if needed) personally updated the patient's problem list, medications, allergies, past medical and surgical history, social and family history.   PAST MEDICAL HISTORY   Past Medical History:  Diagnosis Date   Bilateral lower extremity edema - mild, likely venous stasis 05/16/2013   CAD (coronary artery disease) of bypass graft 2004/2008   Cath 2008: SVG-D1 Occluded with patent D1, LM 90%, Patent LIMA-LAD & SVG-OM1, patent Native RCA, EF ~45-50% & 2+ MR.;  Myoview 2012: No ischemia or infarction, Normal EF; Echo 2013: Nl LV Fxn & normal valves    Essential hypertension    H/O unstable angina  2003   Cardiac cath with multivessel disease referred for CABG   Hyperlipidemia LDL goal <70    Hypertension, benign    Left main coronary artery disease 2003   70% ostial left main, 100% Cx,; patent,  normal RCA   Leg edema, left 04/08/2015   OSA on CPAP    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 Laneta SimmersBartle)     PAST SURGICAL HISTORY   Past Surgical History:  Procedure Laterality Date   CARDIAC CATHETERIZATION  01/2002   70% left main, 100 % Cx @ OM; normal RCA   CARDIAC CATHETERIZATION  2004, 2008   Occluded SVG-D1 with antegrade native D1 flow; LM increased from 70-90% with patent LIMA and SVG-OM1; patent RCA ; EF of 45-50% with 2+ MR    Carotid Duplex Ultrasound  09/2012   Less than 49% right carotid; relatively normal left carotid.   CORONARY ARTERY BYPASS GRAFT  01/2002   Dr. Laneta SimmersBartle: LIMA-LAD, SVG-OM 1, SVG-D1   NM MYOVIEW LTD  2012; 11/2014   a. No evidence of ischemia, normal EF;; b. Hyperdynamic LV, EF 69%. Hypertensive response to exercise. ST depressions in V5 and V6. Small, moderate intensity, fixed defect in the distal anterior and apical wall consistent with soft tissue attenuation. LOW RISK.   TRANSTHORACIC ECHOCARDIOGRAM  2013   Normal LV function with normal valves.    Immunization History  Administered Date(s) Administered   Influenza,inj,Quad PF,6+ Mos 12/23/2015   Influenza-Unspecified 12/06/2013   PFIZER SARS-COV-2 Vaccination 04/16/2019, 05/09/2019   Pneumococcal-Unspecified 12/06/2013   Zoster 07/06/2012    MEDICATIONS/ALLERGIES   Current Meds  Medication Sig   cholecalciferol (VITAMIN D) 1000 UNITS tablet Take 1,000 Units by mouth daily.   Coenzyme Q10-Vitamin E (QUNOL ULTRA COQ10 PO) Take 1 capsule by mouth daily.   felodipine (PLENDIL) 2.5 MG 24 hr tablet Take 1 tablet (2.5 mg total) by mouth daily.   fexofenadine (ALLEGRA) 180 MG tablet Take 180 mg by mouth daily as needed for allergies or rhinitis.   folic acid (FOLVITE) 400 MCG tablet Take 800 mcg by mouth daily.    irbesartan-hydrochlorothiazide (AVALIDE) 300-12.5 MG per tablet Take 1 tablet by mouth daily.    KLOR-CON M20 20 MEQ tablet TAKE 1 TABLET BY MOUTH  EVERY DAY   Magnesium 250 MG TABS Take 1 tablet by mouth daily.   metoprolol succinate (TOPROL-XL) 100 MG 24 hr tablet TAKE 1 TABLET DAILY WITH OR IMMEDIATELY FOLLOWING A MEAL   Multiple Vitamin (MULTIVITAMIN) capsule Take 1 capsule by mouth daily.   Omega-3 Fatty Acids (FISH OIL) 1200 MG CAPS Take 3 capsules (3,600 mg total) by mouth daily.   rosuvastatin (CRESTOR) 40 MG tablet Alternating every other day  taking 20 mg with 40 mg daily (patient can split the tablets for alternating days)   [DISCONTINUED] clopidogrel (PLAVIX) 75 MG tablet Take 1 tablet (75 mg total) by mouth daily.    Allergies  Allergen Reactions   Etodolac Anaphylaxis    migraines   Amlodipine     PATIENT NOT SURE THINKS LEG SWELLING. USE IN THE 1990'S   Motrin [Ibuprofen] Swelling    Throat swells   Statins     Myalgias    Carvedilol Rash    SOCIAL HISTORY/FAMILY HISTORY   Reviewed in Epic:  Pertinent findings: No change  OBJCTIVE -PE, EKG, labs   Wt Readings from Last 3 Encounters:  12/20/19 166 lb 3.2 oz (75.4 kg)  11/20/19 168 lb (76.2 kg)  12/20/18 168 lb (76.2 kg)    Physical Exam: BP (!) 146/70    Pulse 72    Ht 5\' 3"  (1.6 m)    Wt 166 lb 3.2 oz (75.4 kg)    BMI 29.44 kg/m  Physical Exam Vitals reviewed.  Constitutional:      General: She is not in acute distress.    Appearance: Normal appearance. She is not ill-appearing or toxic-appearing.     Comments: Borderline obese.  Well-groomed.  HENT:     Head: Normocephalic and atraumatic.     Comments: Wears glasses Eyes:     Extraocular Movements: Extraocular movements intact.  Neck:     Vascular: No carotid bruit or JVD.  Cardiovascular:     Rate and Rhythm: Normal rate and regular rhythm.  No extrasystoles are present.    Chest Wall: PMI is not displaced.     Pulses: Normal pulses.     Heart sounds: S1 normal and S2 normal. Heart sounds are distant. Murmur (Very soft HSM at apex.) heard.  No friction rub. No gallop.    Pulmonary:     Effort: Pulmonary effort is normal. No respiratory distress.     Breath sounds: Normal breath sounds.  Chest:     Chest wall: No tenderness.  Musculoskeletal:        General: Normal range of motion.     Cervical back: Normal range of motion and neck supple. No tenderness.     Right lower leg: Edema (Trivial) present.     Left lower leg: Edema (1-2+ mid calf) present.  Lymphadenopathy:     Cervical: No cervical adenopathy.  Neurological:     General: No focal deficit present.     Mental Status: She is alert and oriented to person, place, and time.  Psychiatric:        Mood and Affect: Mood normal.        Behavior: Behavior normal.        Thought Content: Thought content normal.        Judgment: Judgment normal.    Adult ECG Report N/A   Recent Labs: Labs were ordered today. ==> See results in addendum   ASSESSMENT/PLAN    Problem List Items Addressed This Visit    Left main coronary artery disease - 90% - Primary (Chronic)    There is progression of left main disease on her most recent cath.  Thankfully Myoview was negative.  On stable regimen.  We do have room to titrate up felodipine if her pressures rise. She is on 40 mg statin alternating a day with 20 mg daily.  She is due for lipids to be checked.  Based on results, I suspect that simply going to 40 mg daily would not make full change.  Would probably need to add a new medication in the combination Nexletol-ezetimibe is a good option.  We will update once her labs are returned.      Relevant Medications   aspirin EC 81 MG tablet   Coronary artery disease involving coronary bypass graft - occluded SVG-D1 (Chronic)    Last cardiac catheterization was in 2008 showing occluded SVG-D1 with antegrade flow in the native D1 meaning that it may not be necessary.  Just had a Myoview stress test showing no evidence of ischemia.  This was her routine surveillance Myoview.  Would not be due again for another 4 to  5 years.  She is asymptomatic as far as any angina goes and is on stable regimen.    Plan: Continue current dose of Toprol and Avalide   Low threshold to increase felodipine to 5 mg daily.  On 40 mg rosuvastatin.  Due for labs to be checked.  We will convert from Plavix 75 mg to aspirin 81 mg p.o. -> okay to hold aspirin 5 days preop for surgeries or procedures.      Relevant Medications   aspirin EC 81 MG tablet   S/P CABG x 3;  LIMA-LAD, SVG-OM1, SVG-D1 (occluded) (Chronic)    Myoview nonischemic as of last month.  Follow-up in 4 to 5 years unless you have worsening symptoms.      Essential hypertension (Chronic)    Blood pressure is borderline today.  She says it is better than this at home.  I have asked that she monitor pressures to ensure that they are staying down below the goal of 135/80 mmHg.  If she is ranging up above that and her average pressures, I would increase if blood pain 5 mg daily. Otherwise continue current dose of Toprol and Avalide.      Relevant Medications   aspirin EC 81 MG tablet   Hyperlipidemia LDL goal <70 (Chronic)   Relevant Medications   aspirin EC 81 MG tablet   Leg edema, left    Chronic finding.  We have evaluated for DVT in the past and no evidence.  She probably has some deep venous insufficiency from a.  Prior DVT. Plan continue recommend foot elevation and the support socks.      Medication management       COVID-19 Education: The signs and symptoms of COVID-19 were discussed with the patient and how to seek care for testing (follow up with PCP or arrange E-visit).   The importance of social distancing and COVID-19 vaccination was discussed today. 1 min The patient is practicing social distancing & Masking.   I spent a total of with the patient spent in direct patient consultation.  Additional time spent with chart review  / charting (studies, outside notes, etc): 8 Total Time: 29 min   Current medicines are reviewed  at length with the patient today.  (+/- concerns) n/a  This visit occurred during the SARS-CoV-2 public health emergency.  Safety protocols were in place, including screening questions prior to the visit, additional usage of staff PPE, and extensive cleaning of exam room while observing appropriate contact time as indicated for disinfecting solutions.  Notice: This dictation was prepared with Dragon dictation along with smaller phrase technology. Any transcriptional errors that result from this process are unintentional and may not be corrected upon review.  Patient Instructions / Medication Changes & Studies & Tests Ordered   Patient Instructions  Medication Instructions:   Stop taking clopidogrel  And Start taking Enteric coated aspirin 81 mg one tablet daily   *If you need a refill on your cardiac medications before your next appointment, please call your pharmacy*   Lab Work: cmp Lipid  If you have labs (blood work) drawn today and your tests are completely normal, you will receive your results only by:  MyChart Message (if you have MyChart) OR  A paper copy in the mail If you have any lab test that is abnormal or we need to change your treatment, we will call you to review the results.   Testing/Procedures: Not  needed   Follow-Up: At [facility], you and your health needs are our priority.  As part of our continuing mission to provide you with exceptional heart care, we have created designated Provider Care Teams.  These Care Teams include your primary Cardiologist (physician) and Advanced Practice Providers (APPs -  Physician Assistants and Nurse Practitioners) who all work together to provide you with the care you need, when you need it.  We recommend signing up for the patient portal called "MyChart".  Sign up information is provided on this After Visit Summary.  MyChart is used to connect with patients for Virtual Visits (Telemedicine).  Patients are able to view  lab/test results, encounter notes, upcoming appointments, etc.  Non-urgent messages can be sent to your provider as well.   To learn more about what you can do with MyChart, go to [email or web address].    Your next appointment:   12 month(s)  The format for your next appointment:   In Person  Provider:   Bryan Lemma, MD   Other Instructions    Studies Ordered:   Orders Placed This Encounter  Procedures   Comprehensive metabolic panel   Lab Results  Component Value Date   CHOL 174 12/26/2019   HDL 55 12/26/2019   LDLCALC 82 12/26/2019   TRIG 227 (H) 12/26/2019   CHOLHDL 3.2 12/26/2019   Lab Results  Component Value Date   CREATININE 0.78 12/26/2019   BUN 15 12/26/2019   NA 141 12/26/2019   K 4.6 12/26/2019   CL 100 12/26/2019   CO2 26 12/26/2019   Based on these results, recommendation was to start Nexlizet 180/10 mg p.o. daily.  Prescription called in.     Bryan Lemma, M.D., M.S. Interventional Cardiologist   Pager # [phone] Phone # [phone] [address] [place], [place] [address]   Thank you for choosing Heartcare at [facility]!!

## 2019-12-26 LAB — COMPREHENSIVE METABOLIC PANEL
ALT: 14 IU/L (ref 0–32)
AST: 11 IU/L (ref 0–40)
Albumin/Globulin Ratio: 2.3 — ABNORMAL HIGH (ref 1.2–2.2)
Albumin: 4.6 g/dL (ref 3.7–4.7)
Alkaline Phosphatase: 73 IU/L (ref 44–121)
BUN/Creatinine Ratio: 19 (ref 12–28)
BUN: 15 mg/dL (ref 8–27)
Bilirubin Total: 0.5 mg/dL (ref 0.0–1.2)
CO2: 26 mmol/L (ref 20–29)
Calcium: 9.5 mg/dL (ref 8.7–10.3)
Chloride: 100 mmol/L (ref 96–106)
Creatinine, Ser: 0.78 mg/dL (ref 0.57–1.00)
GFR calc Af Amer: 87 mL/min/{1.73_m2} (ref >59)
GFR calc non Af Amer: 75 mL/min/{1.73_m2} (ref >59)
Globulin, Total: 2 g/dL (ref 1.5–4.5)
Glucose: 109 mg/dL — ABNORMAL HIGH (ref 65–99)
Potassium: 4.6 mmol/L (ref 3.5–5.2)
Sodium: 141 mmol/L (ref 134–144)
Total Protein: 6.6 g/dL (ref 6.0–8.5)

## 2019-12-26 LAB — LIPID PANEL
Chol/HDL Ratio: 3.2 ratio (ref 0.0–4.4)
Cholesterol, Total: 174 mg/dL (ref 100–199)
HDL: 55 mg/dL (ref >39)
LDL Chol Calc (NIH): 82 mg/dL (ref 0–99)
Triglycerides: 227 mg/dL — ABNORMAL HIGH (ref 0–149)
VLDL Cholesterol Cal: 37 mg/dL (ref 5–40)

## 2019-12-27 NOTE — Assessment & Plan Note (Signed)
Myoview nonischemic as of last month.  Follow-up in 4 to 5 years unless you have worsening symptoms.

## 2019-12-27 NOTE — Assessment & Plan Note (Signed)
There is progression of left main disease on her most recent cath.  Thankfully Myoview was negative.  On stable regimen.  We do have room to titrate up felodipine if her pressures rise. She is on 40 mg statin alternating a day with 20 mg daily.  She is due for lipids to be checked.  Based on results, I suspect that simply going to 40 mg daily would not make full change.  Would probably need to add a new medication in the combination Nexletol-ezetimibe is a good option.  We will update once her labs are returned.

## 2019-12-27 NOTE — Assessment & Plan Note (Signed)
Last cardiac catheterization was in 2008 showing occluded SVG-D1 with antegrade flow in the native D1 meaning that it may not be necessary.  Just had a Myoview stress test showing no evidence of ischemia.  This was her routine surveillance Myoview.  Would not be due again for another 4 to 5 years.  She is asymptomatic as far as any angina goes and is on stable regimen.    Plan: Continue current dose of Toprol and Avalide   Low threshold to increase felodipine to 5 mg daily.  On 40 mg rosuvastatin.  Due for labs to be checked.  We will convert from Plavix 75 mg to aspirin 81 mg p.o. -> okay to hold aspirin 5 days preop for surgeries or procedures.

## 2019-12-27 NOTE — Assessment & Plan Note (Signed)
Blood pressure is borderline today.  She says it is better than this at home.  I have asked that she monitor pressures to ensure that they are staying down below the goal of 135/80 mmHg.  If she is ranging up above that and her average pressures, I would increase if blood pain 5 mg daily. Otherwise continue current dose of Toprol and Avalide.

## 2019-12-27 NOTE — Assessment & Plan Note (Signed)
Chronic finding.  We have evaluated for DVT in the past and no evidence.  She probably has some deep venous insufficiency from a.  Prior DVT. Plan continue recommend foot elevation and the support socks.

## 2020-01-15 ENCOUNTER — Other Ambulatory Visit: Payer: Self-pay | Admitting: Cardiology

## 2020-01-16 ENCOUNTER — Other Ambulatory Visit: Payer: Self-pay | Admitting: Cardiology

## 2020-01-16 DIAGNOSIS — E785 Hyperlipidemia, unspecified: Secondary | ICD-10-CM

## 2020-02-28 ENCOUNTER — Other Ambulatory Visit: Payer: Self-pay | Admitting: Cardiology

## 2020-05-21 ENCOUNTER — Other Ambulatory Visit: Payer: Self-pay | Admitting: Cardiology

## 2020-09-30 ENCOUNTER — Other Ambulatory Visit: Payer: Self-pay | Admitting: Family Medicine

## 2020-09-30 DIAGNOSIS — Z1231 Encounter for screening mammogram for malignant neoplasm of breast: Secondary | ICD-10-CM

## 2020-09-30 DIAGNOSIS — M8589 Other specified disorders of bone density and structure, multiple sites: Secondary | ICD-10-CM

## 2020-10-10 ENCOUNTER — Other Ambulatory Visit: Payer: Self-pay | Admitting: Cardiology

## 2020-11-20 ENCOUNTER — Ambulatory Visit
Admission: RE | Admit: 2020-11-20 | Discharge: 2020-11-20 | Disposition: A | Discharge status: Home / Self Care | Payer: Medicare Other | Source: Ambulatory Visit | Attending: Family Medicine | Admitting: Family Medicine

## 2020-11-20 ENCOUNTER — Other Ambulatory Visit: Payer: Self-pay

## 2020-11-20 DIAGNOSIS — Z1231 Encounter for screening mammogram for malignant neoplasm of breast: Secondary | ICD-10-CM

## 2021-01-07 ENCOUNTER — Other Ambulatory Visit: Payer: Self-pay | Admitting: Cardiology

## 2021-03-11 ENCOUNTER — Encounter: Payer: Self-pay | Admitting: Cardiology

## 2021-03-11 ENCOUNTER — Other Ambulatory Visit: Payer: Self-pay

## 2021-03-11 ENCOUNTER — Ambulatory Visit (INDEPENDENT_AMBULATORY_CARE_PROVIDER_SITE_OTHER): Payer: Medicare Other | Admitting: Cardiology

## 2021-03-11 VITALS — BP 120/70 | HR 70 | Ht 63.0 in | Wt 154.2 lb

## 2021-03-11 DIAGNOSIS — I25810 Atherosclerosis of coronary artery bypass graft(s) without angina pectoris: Secondary | ICD-10-CM | POA: Diagnosis not present

## 2021-03-11 DIAGNOSIS — Z951 Presence of aortocoronary bypass graft: Secondary | ICD-10-CM

## 2021-03-11 DIAGNOSIS — I2510 Atherosclerotic heart disease of native coronary artery without angina pectoris: Secondary | ICD-10-CM | POA: Diagnosis not present

## 2021-03-11 DIAGNOSIS — E785 Hyperlipidemia, unspecified: Secondary | ICD-10-CM

## 2021-03-11 DIAGNOSIS — Z79899 Other long term (current) drug therapy: Secondary | ICD-10-CM

## 2021-03-11 DIAGNOSIS — I10 Essential (primary) hypertension: Secondary | ICD-10-CM

## 2021-03-11 DIAGNOSIS — R600 Localized edema: Secondary | ICD-10-CM

## 2021-03-11 NOTE — Patient Instructions (Signed)
Medication Instructions:   NO CHANGES *If you need a refill on your cardiac medications before your next appointment, please call your pharmacy*   Lab Work: CMP LIPIDS TODAY If you have labs (blood work) drawn today and your tests are completely normal, you will receive your results only by: MyChart Message (if you have MyChart) OR A paper copy in the mail If you have any lab test that is abnormal or we need to change your treatment, we will call you to review the results.   Testing/Procedures: Not needed   Follow-Up: At [facility], you and your health needs are our priority.  As part of our continuing mission to provide you with exceptional heart care, we have created designated Provider Care Teams.  These Care Teams include your primary Cardiologist (physician) and Advanced Practice Providers (APPs -  Physician Assistants and Nurse Practitioners) who all work together to provide you with the care you need, when you need it.    Your next appointment:   9 to 10 month(s)  The format for your next appointment:   In Person  Provider:   Bryan Lemma, MD

## 2021-03-11 NOTE — Progress Notes (Signed)
Primary Care Provider: Alroy Dust, L.Marlou Sa, MD Cardiologist: Glenetta Hew, MD Electrophysiologist: None  Clinic Note: Chief Complaint  Patient presents with   Follow-up    Delayed annual.  Last seen in October 2021 Doing well   Coronary Artery Disease    Stable.  No angina.   ===================================  ASSESSMENT/PLAN   Problem List Items Addressed This Visit       Cardiology Problems   Coronary artery disease involving native coronary artery of native heart without angina pectoris - Primary (Chronic)    Left main disease with noted progression on cath back in 2008.  We have done several Myoview's to follow her up.  Most recently nonischemic Myoview, 2021. No active anginal symptoms on current meds.    Plan: Meds refilled Converted from clopidogrel to aspirin 81 mg. Continue current dose of Toprol 100 mg daily, and felodipine 2.5 mg daily for BP/antianginal effect. Continue irbesartan and HCTZ 300-12.5 mg daily for afterload reduction. Continue rosuvastatin 4 mg daily.      Relevant Orders   EKG 12-Lead (Completed)   Lipid panel (Completed)   Comprehensive metabolic panel (Completed)   Coronary artery disease involving coronary bypass graft - occluded SVG-D1 (Chronic)    Last catheterization in 2008 showed occluded SVG-D1 however there was antegrade flow noted in the native D1 branch indicating that it may not have been a necessary graft.  As such, no evidence of ischemia noted on Myoview.  Last surveillance Myoview was in September 2021-would be due in 2026      Relevant Orders   EKG 12-Lead (Completed)   Lipid panel (Completed)   Comprehensive metabolic panel (Completed)   Essential hypertension (Chronic)    Blood pressure stable 120/70 on current meds.  Refill current doses of Toprol, irbesartan and HCTZ in for review.      Hyperlipidemia LDL goal <70 (Chronic)    Labs checked today showed LDL 73 on current dose of rosuvastatin.  She is bouncing  back and forth between LDL in the 60s and LDL in the 70s.  She really does not want to go on the medication.  Continue to monitor, low threshold to consider adding Zetia.      Relevant Orders   Lipid panel (Completed)     Other   S/P CABG x 3;  LIMA-LAD, SVG-OM1, SVG-D1 (occluded) (Chronic)   Relevant Orders   EKG 12-Lead (Completed)   Bilateral lower extremity edema - mild, likely venous stasis (Chronic)    Chronic, stable.  No evidence of DVT.  I suspect she has some deep venous insufficiency from her prior DVT.  Continue foot elevation and support socks.  Prefer to avoid diuretic.  Would also probably not want to titrate felodipine further.      Medication management    Check chemistry and lipid panel today       ===================================  HPI:    [name] is a 76 y.o. female with a PMH notable for LM-CAD w/ CABG below who presents today for delayed annual f/u.  Cardiac Cath 2003: 70% LM, 100% LCx and OM.  Normal RCA. =>  Referred for CABG (LIMA-LAD, SVG-OM1, SVG-D1. Last Cath 2008: LM 70-90%.  Occluded SVG-D1.  LIMA-LAD and SVG-OM1.  Antegrade flow noted in native D1.  EF 45 to 50%.  2+ MR. Myoview 11/26/2019: EF 65-70%.  No R WMA.  No ischemia or infarction.  [name] was last seen on December 20, 2019 to follow-up the results of screening Myoview reviewed  above.  She was feeling well.  No major issues.  Just not very active because of COVID restrictions.  Still doing her gardening.  Chronic left > right lower extremity swelling.  Using support socks during cooler weather and elevates her feet.  Still using CPAP. Stop Plavix.  Changed to ASA 81 mg Labs ordered.  Recent Hospitalizations: None  Reviewed  CV studies:    The following studies were reviewed today: (if available, images/films reviewed: From Epic Chart or Care Everywhere) None:   Interval History:   [name] returns here today for delayed annual follow-up doing pretty well.  She is  active as always, but does not necessarily do routine exercise.  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  She denies any symptoms of chest tightness pressure or dyspnea with rest or exertion.  No PND orthopnea but she does have chronic left greater than right leg swelling but it has gone down some history started using support stockings.  Usually this swelling is gone by the morning and then builds up during the day.  Otherwise, she is doing well and stable from a cardiac standpoint has no major issues.  She was happy to no longer be on Plavix, not having any significant bleeding issues.  She continues to do well tolerating her CPAP better.  CV Review of Symptoms (Summary) Cardiovascular ROS: no chest pain or dyspnea on exertion positive for - edema and this is stable negative for - irregular heartbeat, loss of consciousness, orthopnea, palpitations, paroxysmal nocturnal dyspnea, rapid heart rate, shortness of breath, or syncope/bear syncope, TIA/amaurosis fugax, claudication.  REVIEWED OF SYSTEMS   Review of Systems  Constitutional:  Positive for weight loss (Intentional). Negative for malaise/fatigue.  HENT:  Negative for nosebleeds.   Respiratory:  Negative for cough and shortness of breath.   Cardiovascular:  Positive for leg swelling (Chronic L>R -> goes down at night with elevation.).  Gastrointestinal:  Negative for blood in stool and melena.  Genitourinary:  Negative for dysuria and hematuria.  Musculoskeletal:  Positive for joint pain. Negative for falls and myalgias.  Neurological:  Negative for dizziness and weakness.  Psychiatric/Behavioral:  Negative for depression and memory loss. The patient is not nervous/anxious and does not have insomnia.   Joint pain  I have reviewed and (if needed) personally updated the patient's problem list, medications, allergies, past medical and surgical history, social and family history.   PAST MEDICAL  HISTORY   Past Medical History:  Diagnosis Date   Bilateral lower extremity edema - mild, likely venous stasis 05/16/2013   CAD (coronary artery disease) of bypass graft 2004/2008   Cath 2008: SVG-D1 Occluded with patent D1, LM 90%, Patent LIMA-LAD & SVG-OM1, patent Native RCA, EF ~45-50% & 2+ MR.; Myoview 2012: No ischemia or infarction, Normal EF; Echo 2013: Nl LV Fxn & normal valves    Essential hypertension    H/O unstable angina  2003   Cardiac cath with multivessel disease referred for CABG   Hyperlipidemia LDL goal <70    Hypertension, benign    Left main coronary artery disease 2003   70% ostial left main, 100% Cx,; patent, normal RCA   Leg edema, left 04/08/2015   OSA on CPAP    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 Laneta Simmers)     PAST SURGICAL HISTORY   Past Surgical History:  Procedure Laterality Date   CARDIAC CATHETERIZATION  01/2002  70% left main, 100 % Cx @ OM; normal RCA   CARDIAC CATHETERIZATION  2004, 2008   Occluded SVG-D1 with antegrade native D1 flow; LM increased from 70-90% with patent LIMA and SVG-OM1; patent RCA ; EF of 45-50% with 2+ MR    Carotid Duplex Ultrasound  09/2012   Less than 49% right carotid; relatively normal left carotid.   CORONARY ARTERY BYPASS GRAFT  01/2002   Dr. Cyndia Bent: LIMA-LAD, SVG-OM 1, SVG-D1   NM MYOVIEW LTD  11/26/2019   EF 65-70%.  No R WMA.  No ischemia or infarction.   TRANSTHORACIC ECHOCARDIOGRAM  2013   Normal LV function with normal valves.    Immunization History  Administered Date(s) Administered   Influenza,inj,Quad PF,6+ Mos 12/23/2015   Influenza-Unspecified 12/06/2013   PFIZER(Purple Top)SARS-COV-2 Vaccination 04/16/2019, 05/09/2019   Pneumococcal-Unspecified 12/06/2013   Zoster, Live 07/06/2012    MEDICATIONS/ALLERGIES   Current Meds  Medication Sig   aspirin EC 81 MG tablet Take 1 tablet (81 mg total) by mouth daily. Swallow whole.   cholecalciferol (VITAMIN D) 1000 UNITS tablet Take  1,000 Units by mouth daily.   Coenzyme Q10-Vitamin E (QUNOL ULTRA COQ10 PO) Take 1 capsule by mouth daily.   felodipine (PLENDIL) 2.5 MG 24 hr tablet TAKE 1 TABLET BY MOUTH EVERY DAY   fexofenadine (ALLEGRA) 180 MG tablet Take 180 mg by mouth daily as needed for allergies or rhinitis.   folic acid (FOLVITE) A999333 MCG tablet Take 800 mcg by mouth daily.    irbesartan-hydrochlorothiazide (AVALIDE) 300-12.5 MG per tablet Take 1 tablet by mouth daily.    KLOR-CON M20 20 MEQ tablet TAKE 1 TABLET BY MOUTH EVERY DAY   Magnesium 250 MG TABS Take 1 tablet by mouth daily.   metoprolol succinate (TOPROL-XL) 100 MG 24 hr tablet TAKE 1 TABLET DAILY WITH OR IMMEDIATELY FOLLOWING A MEAL   Multiple Vitamin (MULTIVITAMIN) capsule Take 1 capsule by mouth daily.   Omega-3 Fatty Acids (FISH OIL) 1200 MG CAPS Take 3 capsules (3,600 mg total) by mouth daily.   [DISCONTINUED] rosuvastatin (CRESTOR) 40 MG tablet ALTERNATING EVERY OTHER DAY TAKING 20 MG WITH 40 MG DAILY (PATIENT CAN SPLIT THE TABLETS FOR ALTERNATING DAYS)    Allergies  Allergen Reactions   Etodolac Anaphylaxis    migraines   Amlodipine     PATIENT NOT SURE THINKS LEG SWELLING. USE IN THE 1990'S   Atorvastatin Other (See Comments)   Motrin [Ibuprofen] Swelling    Throat swells   Simvastatin Other (See Comments)   Statins     Myalgias    Carvedilol Rash    SOCIAL HISTORY/FAMILY HISTORY   Reviewed in Epic:  Pertinent findings:  Social History   Tobacco Use   Smoking status: Never   Smokeless tobacco: Never  Vaping Use   Vaping Use: Never used  Substance Use Topics   Alcohol use: No   Drug use: No   Social History   Social History Narrative   76 year old married white woman, mother of 21, grandmother of 57.   Never smoked. Rare social alcohol.   She is a Nature conservation officer around the house, and does lots of the chart work.   Her husband is also a former patient Dr. Rollene Fare, and now follows up with Dr. Ellyn Hack.    OBJCTIVE -PE, EKG, labs    Wt Readings from Last 3 Encounters:  03/11/21 154 lb 3.2 oz (69.9 kg)  12/20/19 166 lb 3.2 oz (75.4 kg)  11/20/19 168 lb (76.2 kg)  Physical Exam: BP 120/70 (BP Location: Left Arm)    Pulse 70    Ht 5\' 3"  (1.6 m)    Wt 154 lb 3.2 oz (69.9 kg)    SpO2 98%    BMI 27.32 kg/m  Physical Exam Constitutional:      General: She is not in acute distress.    Appearance: Normal appearance. She is normal weight. She is not ill-appearing or toxic-appearing.  HENT:     Head: Normocephalic and atraumatic.  Neck:     Vascular: No carotid bruit or JVD.  Cardiovascular:     Rate and Rhythm: Normal rate and regular rhythm. No extrasystoles are present.    Chest Wall: PMI is not displaced.     Pulses: Decreased pulses (Somewhat decreased on left foot due to edema.).     Heart sounds: S1 normal and S2 normal. Heart sounds are distant. Murmur (Soft HSM at apex but also soft SEM at base.) heard.    No friction rub. No gallop.  Pulmonary:     Effort: Pulmonary effort is normal. No respiratory distress.     Breath sounds: Normal breath sounds. No wheezing, rhonchi or rales.  Musculoskeletal:        General: Swelling (1-2+ left ankle: Trace right) present.     Cervical back: Normal range of motion and neck supple.  Skin:    General: Skin is warm and dry.  Neurological:     General: No focal deficit present.     Mental Status: She is alert and oriented to person, place, and time. Mental status is at baseline.  Psychiatric:        Behavior: Behavior normal.        Thought Content: Thought content normal.        Judgment: Judgment normal.     Adult ECG Report  Rate: 70 ;  Rhythm: normal sinus rhythm and ~LAE.  Otherwise normal axis, intervals and durations. ;   Narrative Interpretation: Stable EKG  Recent Labs:   checked today  Lab Results  Component Value Date   CHOL 151 03/11/2021   HDL 56 03/11/2021   LDLCALC 73 03/11/2021   TRIG 124 03/11/2021   CHOLHDL 2.7 03/11/2021   Lab Results   Component Value Date   CREATININE 0.76 03/11/2021   BUN 14 03/11/2021   NA 141 03/11/2021   K 4.5 03/11/2021   CL 101 03/11/2021   CO2 26 03/11/2021    ==================================================  COVID-19 Education: The signs and symptoms of COVID-19 were discussed with the patient and how to seek care for testing (follow up with PCP or arrange E-visit).    I spent a total of 24 minutes with the patient spent in direct patient consultation.  Additional time spent with chart review  / charting (studies, outside notes, etc): 12 min Total Time: n/a min  Current medicines are reviewed at length with the patient today.  (+/- concerns) n/a  This visit occurred during the SARS-CoV-2 public health emergency.  Safety protocols were in place, including screening questions prior to the visit, additional usage of staff PPE, and extensive cleaning of exam room while observing appropriate contact time as indicated for disinfecting solutions.  Notice: This dictation was prepared with Dragon dictation along with smart phrase technology. Any transcriptional errors that result from this process are unintentional and may not be corrected upon review.  Studies Ordered:   Orders Placed This Encounter  Procedures   Lipid panel   Comprehensive metabolic panel  EKG 12-Lead    Patient Instructions / Medication Changes & Studies & Tests Ordered   Patient Instructions  Medication Instructions:   NO CHANGES *If you need a refill on your cardiac medications before your next appointment, please call your pharmacy*   Lab Work: [place] If you have labs (blood work) drawn today and your tests are completely normal, you will receive your results only by: [place] (if you have MyChart) OR A paper copy in the mail If you have any lab test that is abnormal or we need to change your treatment, we will call you to review the results.   Testing/Procedures: Not  needed   Follow-Up: At [facility], you and your health needs are our priority.  As part of our continuing mission to provide you with exceptional heart care, we have created designated Provider Care Teams.  These Care Teams include your primary Cardiologist (physician) and Advanced Practice Providers (APPs -  Physician Assistants and Nurse Practitioners) who all work together to provide you with the care you need, when you need it.    Your next appointment:   9 to 10 month(s)  The format for your next appointment:   In Person  Provider:   Glenetta Hew, MD           Glenetta Hew, M.D., M.S. Interventional Cardiologist   Pager # [phone] Phone # [phone] [address] [place], [place] [address]   Thank you for choosing [facility] at [facility]!!

## 2021-03-12 LAB — COMPREHENSIVE METABOLIC PANEL
ALT: 11 IU/L (ref 0–32)
AST: 12 IU/L (ref 0–40)
Albumin/Globulin Ratio: 2.2 (ref 1.2–2.2)
Albumin: 4.4 g/dL (ref 3.7–4.7)
Alkaline Phosphatase: 64 IU/L (ref 44–121)
BUN/Creatinine Ratio: 18 (ref 12–28)
BUN: 14 mg/dL (ref 8–27)
Bilirubin Total: 0.7 mg/dL (ref 0.0–1.2)
CO2: 26 mmol/L (ref 20–29)
Calcium: 10 mg/dL (ref 8.7–10.3)
Chloride: 101 mmol/L (ref 96–106)
Creatinine, Ser: 0.76 mg/dL (ref 0.57–1.00)
Globulin, Total: 2 g/dL (ref 1.5–4.5)
Glucose: 103 mg/dL — ABNORMAL HIGH (ref 70–99)
Potassium: 4.5 mmol/L (ref 3.5–5.2)
Sodium: 141 mmol/L (ref 134–144)
Total Protein: 6.4 g/dL (ref 6.0–8.5)
eGFR: 82 mL/min/{1.73_m2} (ref >59)

## 2021-03-12 LAB — LIPID PANEL
Chol/HDL Ratio: 2.7 ratio (ref 0.0–4.4)
Cholesterol, Total: 151 mg/dL (ref 100–199)
HDL: 56 mg/dL (ref >39)
LDL Chol Calc (NIH): 73 mg/dL (ref 0–99)
Triglycerides: 124 mg/dL (ref 0–149)
VLDL Cholesterol Cal: 22 mg/dL (ref 5–40)

## 2021-03-13 ENCOUNTER — Telehealth: Payer: Self-pay | Admitting: Cardiology

## 2021-03-13 DIAGNOSIS — E785 Hyperlipidemia, unspecified: Secondary | ICD-10-CM

## 2021-03-13 MED ORDER — ROSUVASTATIN CALCIUM 40 MG PO TABS: ORAL_TABLET | ORAL | 3 refills | Status: DC

## 2021-03-13 NOTE — Telephone Encounter (Signed)
*  STAT* If patient is at the pharmacy, call can be transferred to refill team.   1. Which medications need to be refilled? (please list name of each medication and dose if known)  rosuvastatin (CRESTOR) 40 MG tablet  2. Which pharmacy/location (including street and city if local pharmacy) is medication to be sent to? [id number] - [place], [place] - [address]  3. Do they need a 30 day or 90 day supply?  90 day supply 

## 2021-03-13 NOTE — Telephone Encounter (Signed)
Refill sent to pharmacy.   

## 2021-03-26 ENCOUNTER — Ambulatory Visit
Admission: RE | Admit: 2021-03-26 | Discharge: 2021-03-26 | Disposition: A | Discharge status: Home / Self Care | Payer: Medicare Other | Source: Ambulatory Visit | Attending: Family Medicine | Admitting: Family Medicine

## 2021-03-26 DIAGNOSIS — M8589 Other specified disorders of bone density and structure, multiple sites: Secondary | ICD-10-CM

## 2021-03-28 ENCOUNTER — Encounter: Payer: Self-pay | Admitting: Cardiology

## 2021-03-28 NOTE — Assessment & Plan Note (Deleted)
Chronic, stable.  No evidence of DVT.  I suspect she has some deep venous insufficiency from her prior DVT. ° °Continue foot elevation and support socks.  Prefer to avoid diuretic.  Would also probably not want to titrate felodipine further. °

## 2021-03-28 NOTE — Assessment & Plan Note (Signed)
Chronic, stable.  No evidence of DVT.  I suspect she has some deep venous insufficiency from her prior DVT.  Continue foot elevation and support socks.  Prefer to avoid diuretic.  Would also probably not want to titrate felodipine further.

## 2021-03-28 NOTE — Assessment & Plan Note (Signed)
Labs checked today showed LDL 73 on current dose of rosuvastatin.  She is bouncing back and forth between LDL in the 60s and LDL in the 70s.  She really does not want to go on the medication.  Continue to monitor, low threshold to consider adding Zetia.

## 2021-03-28 NOTE — Assessment & Plan Note (Signed)
Last catheterization in 2008 showed occluded SVG-D1 however there was antegrade flow noted in the native D1 branch indicating that it may not have been a necessary graft.  As such, no evidence of ischemia noted on Myoview.  Last surveillance Myoview was in September 2021-would be due in 2026

## 2021-03-28 NOTE — Assessment & Plan Note (Addendum)
Left main disease with noted progression on cath back in 2008.  We have done several Myoview's to follow her up.  Most recently nonischemic Myoview, 2021. No active anginal symptoms on current meds.    Plan: Meds refilled  Converted from clopidogrel to aspirin 81 mg.  Continue current dose of Toprol 100 mg daily, and felodipine 2.5 mg daily for BP/antianginal effect.  Continue irbesartan and HCTZ 300-12.5 mg daily for afterload reduction.  Continue rosuvastatin 4 mg daily.

## 2021-03-28 NOTE — Assessment & Plan Note (Signed)
Check chemistry and lipid panel today

## 2021-03-28 NOTE — Assessment & Plan Note (Signed)
Blood pressure stable 120/70 on current meds.  Refill current doses of Toprol, irbesartan and HCTZ in for review.

## 2021-05-25 ENCOUNTER — Other Ambulatory Visit: Payer: Self-pay | Admitting: Cardiology

## 2021-12-21 ENCOUNTER — Other Ambulatory Visit: Payer: Self-pay | Admitting: Family Medicine

## 2021-12-21 DIAGNOSIS — Z1231 Encounter for screening mammogram for malignant neoplasm of breast: Secondary | ICD-10-CM

## 2022-01-05 NOTE — Progress Notes (Unsigned)
Cardiology Clinic Note   Patient Name: [name] Date of Encounter: 01/07/2022  Primary Care Provider:  Clovis Riley, L.August Saucer, MD Primary Cardiologist:  Bryan Lemma, MD  Patient Profile    [name] 76 year old female presents to the clinic today for follow-up evaluation of her coronary artery disease and essential hypertension.  Past Medical History    Past Medical History:  Diagnosis Date   Bilateral lower extremity edema - mild, likely venous stasis 05/16/2013   CAD (coronary artery disease) of bypass graft 2004/2008   Cath 2008: SVG-D1 Occluded with patent D1, LM 90%, Patent LIMA-LAD & SVG-OM1, patent Native RCA, EF ~45-50% & 2+ MR.; Myoview 2012: No ischemia or infarction, Normal EF; Echo 2013: Nl LV Fxn & normal valves    Essential hypertension    H/O unstable angina  2003   Cardiac cath with multivessel disease referred for CABG   Hyperlipidemia LDL goal <70    Hypertension, benign    Left main coronary artery disease 2003   70% ostial left main, 100% Cx,; patent, normal RCA   Leg edema, left 04/08/2015   OSA on CPAP    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 Laneta Simmers)    Past Surgical History:  Procedure Laterality Date   CARDIAC CATHETERIZATION  01/2002   70% left main, 100 % Cx @ OM; normal RCA   CARDIAC CATHETERIZATION  2004, 2008   Occluded SVG-D1 with antegrade native D1 flow; LM increased from 70-90% with patent LIMA and SVG-OM1; patent RCA ; EF of 45-50% with 2+ MR    Carotid Duplex Ultrasound  09/2012   Less than 49% right carotid; relatively normal left carotid.   CORONARY ARTERY BYPASS GRAFT  01/2002   Dr. Laneta Simmers: LIMA-LAD, SVG-OM 1, SVG-D1   NM MYOVIEW LTD  11/26/2019   EF 65-70%.  No R WMA.  No ischemia or infarction.   TRANSTHORACIC ECHOCARDIOGRAM  2013   Normal LV function with normal valves.    Allergies  Allergies  Allergen Reactions   Etodolac Anaphylaxis    migraines   Amlodipine     PATIENT NOT SURE THINKS LEG  SWELLING. USE IN THE 1990'S   Atorvastatin Other (See Comments)   Motrin [Ibuprofen] Swelling    Throat swells   Simvastatin Other (See Comments)   Statins     Myalgias    Carvedilol Rash    History of Present Illness    [name] is a PMH of coronary artery disease status post CABG x3 (LIMA-LAD, SVG-OM1, SVG-D1), hyperlipidemia, and bilateral lower extremity edema.  She underwent cardiac catheterization in 2003 which showed 70% left main, 100% circumflex and OM.  She was noted to have normal RCA and was referred for CABG.  She underwent CABG x3.  Subsequent cardiac catheterization 2008 which showed left main 70-90%, occluded SVG-D1, LIMA-LAD, SVG-OM with anterograde flow noted in native D1.  EF 45-50%.  Nuclear stress testing 9/21 showed an EF of 65 to 70%, no regional wall motion abnormality, no ischemia or infarct.  She was seen in follow-up 10/21.  She remained stable from a cardiac standpoint at that time.  She had not been very active with COVID restrictions.  She continued to garden.  She was noted to have chronic left greater than right lower extremity swelling.  She was using lower extremity support stockings during cooler weather.  She continues CPAP use.  Her Plavix was discontinued and she was started on ASA 81 mg.  She was seen by Dr. Herbie Baltimore 03/11/21.  She continues to do well from a cardiac standpoint.  She remains physically active.  She was caring for her grandchildren she denied chest tightness or dyspnea.  She denied orthopnea and PND.  She continued to have chronic left greater than right lower extremity edema.  She reported decrease swelling in the morning to wake up.  She was tolerating CPAP use.  She presents to the clinic today for follow-up evaluation and states she has been doing well.  She remains physically active gardening and taking care of her elderly dog who is 16.  She has not been caring for her grandchildren as often.  They have transition to elementary school  and lives on the other side of town.  She notes that her PCP Dr. Clovis Riley will be retiring.  She will be seeing a new PCP within the practice.  We reviewed her prior angiography, CABG, and most recent stress testing.  She expressed understanding.  She has been following a heart healthy low-sodium high-fiber diet.  She is tolerating her medications well without side effects.  We will plan follow-up in 9 to 12 months.  Today she denies chest pain, shortness of breath, lower extremity edema, fatigue, palpitations, melena, hematuria, hemoptysis, diaphoresis, weakness, presyncope, syncope, orthopnea, and PND.      Home Medications    Prior to Admission medications   Medication Sig Start Date End Date Taking? Authorizing Provider  aspirin EC 81 MG tablet Take 1 tablet (81 mg total) by mouth daily. Swallow whole. 12/20/19   Marykay Lex, MD  cholecalciferol (VITAMIN D) 1000 UNITS tablet Take 1,000 Units by mouth daily.    [provider]  Coenzyme Q10-Vitamin E (QUNOL ULTRA COQ10 PO) Take 1 capsule by mouth daily.    [provider]  felodipine (PLENDIL) 2.5 MG 24 hr tablet TAKE 1 TABLET BY MOUTH EVERY DAY 01/07/21   Marykay Lex, MD  fexofenadine (ALLEGRA) 180 MG tablet Take 180 mg by mouth daily as needed for allergies or rhinitis.    [provider]  folic acid (FOLVITE) 400 MCG tablet Take 800 mcg by mouth daily.     [provider]  irbesartan-hydrochlorothiazide (AVALIDE) 300-12.5 MG per tablet Take 1 tablet by mouth daily.  10/29/13   [provider]  KLOR-CON M20 20 MEQ tablet TAKE 1 TABLET BY MOUTH EVERY DAY 05/26/21   Marykay Lex, MD  Magnesium 250 MG TABS Take 1 tablet by mouth daily.    [provider]  metoprolol succinate (TOPROL-XL) 100 MG 24 hr tablet TAKE 1 TABLET DAILY WITH OR IMMEDIATELY FOLLOWING A MEAL 05/26/21   Marykay Lex, MD  Multiple Vitamin (MULTIVITAMIN) capsule Take 1 capsule by mouth daily.    [provider]  nitroGLYCERIN (NITROSTAT) 0.4 MG SL tablet Place 1 tablet (0.4 mg total) under the tongue every 5 (five) minutes as needed for chest pain. 10/19/17 01/17/18  Rollene Rotunda, MD  Omega-3 Fatty Acids (FISH OIL) 1200 MG CAPS Take 3 capsules (3,600 mg total) by mouth daily. 12/22/16   Marykay Lex, MD  rosuvastatin (CRESTOR) 40 MG tablet ALTERNATING EVERY OTHER DAY TAKING 20 MG WITH 40 MG (PATIENT CAN SPLIT THE TABLETS FOR ALTERNATING DAYS) 03/13/21   Marykay Lex, MD    Family History    Family History  Problem Relation Age of Onset   Other Mother        Unknown   Other Father  Unknown   Breast cancer Neg Hx    She indicated that her mother is deceased. She indicated that her father is deceased. She indicated that the status of her neg hx is unknown.  Social History    Social History   Socioeconomic History   Marital status: Married    Spouse name: Not on file   Number of children: Not on file   Years of education: Not on file   Highest education level: Not on file  Occupational History   Not on file  Tobacco Use   Smoking status: Never   Smokeless tobacco: Never  Vaping Use   Vaping Use: Never used  Substance and Sexual Activity   Alcohol use: No   Drug use: No   Sexual activity: Not on file  Other Topics Concern   Not on file  Social History Narrative   76 year old married white woman, mother of 3, grandmother of 4.   Never smoked. Rare social alcohol.   She is a Hotel manager around the house, and does lots of the chart work.   Her husband is also a former patient Dr. Alanda Amass, and now follows up with Dr. Herbie Baltimore.   Social Determinants of Health   Financial Resource Strain: Not on file  Food Insecurity: Not on file  Transportation Needs: Not on file  Physical Activity: Not on file  Stress: Not on file  Social Connections: Not on file  Intimate Partner Violence: Not on file     Review of Systems    General:  No chills, fever, night  sweats or weight changes.  Cardiovascular:  No chest pain, dyspnea on exertion, edema, orthopnea, palpitations, paroxysmal nocturnal dyspnea. Dermatological: No rash, lesions/masses Respiratory: No cough, dyspnea Urologic: No hematuria, dysuria Abdominal:   No nausea, vomiting, diarrhea, bright red blood per rectum, melena, or hematemesis Neurologic:  No visual changes, wkns, changes in mental status. All other systems reviewed and are otherwise negative except as noted above.  Physical Exam    VS:  BP 114/76 (BP Location: Left Arm, Patient Position: Sitting)   Pulse 60   Wt 144 lb (65.3 kg)   SpO2 93%   BMI 25.51 kg/m  , BMI Body mass index is 25.51 kg/m. GEN: Well nourished, well developed, in no acute distress. HEENT: normal. Neck: Supple, no JVD, carotid bruits, or masses. Cardiac: RRR, no murmurs, rubs, or gallops. No clubbing, cyanosis, generalized lower extremity left greater than right edema.  Radials/DP/PT 2+ and equal bilaterally.  Respiratory:  Respirations regular and unlabored, clear to auscultation bilaterally. GI: Soft, nontender, nondistended, BS + x 4. MS: no deformity or atrophy. Skin: warm and dry, no rash. Neuro:  Strength and sensation are intact. Psych: Normal affect.  Accessory Clinical Findings    Recent Labs: 03/11/2021: ALT 11; BUN 14; Creatinine, Ser 0.76; Potassium 4.5; Sodium 141   Recent Lipid Panel    Component Value Date/Time   CHOL 151 03/11/2021 0853   TRIG 124 03/11/2021 0853   HDL 56 03/11/2021 0853   CHOLHDL 2.7 03/11/2021 0853   CHOLHDL 3.7 12/23/2015 0849   VLDL 43 (H) 12/23/2015 0849   LDLCALC 73 03/11/2021 0853         ECG personally reviewed by me today-normal sinus rhythm no ectopy 60 bpm.- No acute changes    Nuclear stress test 11/20/2019 The left ventricular ejection fraction is hyperdynamic (>65%). Nuclear stress EF: 68%. There was no ST segment deviation noted during stress. The study is normal. This is a low  risk  study.   Normal stress nuclear study with no ischemia or infarction.  Gated ejection fraction 68% with normal wall motion.    Assessment & Plan   1.  Coronary artery disease-no chest pain today.  Denies recent episodes of her neck back or chest discomfort.  Status post CABG times x3 in 2003.  Subsequent cardiac catheterization 2008 and stress testing 9/21 showed an EF of 65 to 70%, no regional wall motion abnormality, no ischemia or infarct. Continue aspirin, metoprolol, rosuvastatin Heart healthy low-sodium diet-salty 6 given Increase physical activity as tolerated  Essential hypertension-BP today's 114/76. Continue metoprolol, HCTZ, irbesartan Heart healthy low-sodium diet-salty 6 given Increase physical activity as tolerated  Hyperlipidemia-LDL 73 03/11/21 Continue rosuvastatin, aspirin, omega-3 fatty acids Heart healthy low-sodium high-fiber diet Increase physical activity as tolerated  Bilateral lower extremity edema-chronic.  Stable.  Weight stable.  Left greater than right.   Elevate lower extremities when not active Lower extremity support stockings Healthy low-sodium diet  Disposition: Follow-up with Dr. Ellyn Hack or me in 9-12 months.   Jossie Ng. Zeva Leber NP-C     01/07/2022, 11:38 AM [place] [address] Office [phone] Fax [phone]    I spent 14 minutes examining this patient, reviewing medications, and using patient centered shared decision making involving her cardiac care.  Prior to her visit I spent greater than 20 minutes reviewing her past medical history,  medications, and prior cardiac tests.

## 2022-01-07 ENCOUNTER — Ambulatory Visit: Payer: Medicare Other | Attending: General Practice | Admitting: General Practice

## 2022-01-07 ENCOUNTER — Encounter: Payer: Self-pay | Admitting: General Practice

## 2022-01-07 VITALS — BP 114/76 | HR 60 | Wt 144.0 lb

## 2022-01-07 DIAGNOSIS — R600 Localized edema: Secondary | ICD-10-CM | POA: Diagnosis not present

## 2022-01-07 DIAGNOSIS — I25810 Atherosclerosis of coronary artery bypass graft(s) without angina pectoris: Secondary | ICD-10-CM | POA: Diagnosis not present

## 2022-01-07 DIAGNOSIS — I10 Essential (primary) hypertension: Secondary | ICD-10-CM

## 2022-01-07 DIAGNOSIS — E785 Hyperlipidemia, unspecified: Secondary | ICD-10-CM | POA: Diagnosis not present

## 2022-01-07 NOTE — Patient Instructions (Signed)
Medication Instructions:  The current medical regimen is effective;  continue present plan and medications as directed. Please refer to the Current Medication list given to you today.  *If you need a refill on your cardiac medications before your next appointment, please call your pharmacy*  Lab Work: NONE  Testing/Procedures: NONE  Other Instructions PLEASE READ AND FOLLOW ATTACHED  SALTY 6  PLEASE INCREASE YOUR PHYSICAL ACTIVITY-AS TOLERATED  Follow-Up: At [facility], you and your health needs are our priority.  As part of our continuing mission to provide you with exceptional heart care, we have created designated Provider Care Teams.  These Care Teams include your primary Cardiologist (physician) and Advanced Practice Providers (APPs -  Physician Assistants and Nurse Practitioners) who all work together to provide you with the care you need, when you need it.  Your next appointment:   9-12 month(s)  The format for your next appointment:   In Person  Provider:   Glenetta Hew, MD     Important Information About Sugar

## 2022-02-05 ENCOUNTER — Ambulatory Visit
Admission: RE | Admit: 2022-02-05 | Discharge: 2022-02-05 | Disposition: A | Discharge status: Home / Self Care | Payer: Medicare Other | Source: Ambulatory Visit | Attending: Family Medicine | Admitting: Family Medicine

## 2022-02-05 DIAGNOSIS — Z1231 Encounter for screening mammogram for malignant neoplasm of breast: Secondary | ICD-10-CM

## 2022-02-23 ENCOUNTER — Other Ambulatory Visit: Payer: Self-pay | Admitting: Cardiology

## 2022-03-07 ENCOUNTER — Other Ambulatory Visit: Payer: Self-pay | Admitting: Cardiology

## 2022-03-07 DIAGNOSIS — E785 Hyperlipidemia, unspecified: Secondary | ICD-10-CM

## 2022-03-31 ENCOUNTER — Other Ambulatory Visit: Payer: Self-pay | Admitting: Cardiology

## 2022-08-23 ENCOUNTER — Telehealth: Payer: Self-pay | Admitting: *Deleted

## 2022-08-23 ENCOUNTER — Ambulatory Visit: Payer: Medicare Other | Attending: Nurse Practitioner | Admitting: Nurse Practitioner

## 2022-08-23 DIAGNOSIS — Z01810 Encounter for preprocedural cardiovascular examination: Secondary | ICD-10-CM | POA: Diagnosis not present

## 2022-08-23 NOTE — Telephone Encounter (Signed)
   Pre-operative Risk Assessment    Patient Name: [name]  DOB: 1945-03-18 MRN: [id number]      Request for Surgical Clearance    Procedure:   CLOSED vs OPEN REDUCTION AND INTERNAL FIXATION OF THUMB PROXIMAL PHALANX FRACTURE  Date of Surgery:  Clearance 08/25/22                                 Surgeon:  DR. CHARLES BENFIELD Surgeon's Group or Practice Name:  [facility] Phone number:  [phone] [place] [facility] Fax number:  [phone]   Type of Clearance Requested:   - Medical ; ASA    Type of Anesthesia:  Not Indicated   Additional requests/questions:    Elpidio Anis   08/23/2022, 10:49 AM

## 2022-08-23 NOTE — Anesthesia Preprocedure Evaluation (Signed)
Anesthesia Evaluation  Patient identified by MRN, date of birth, ID band Patient awake    Reviewed: Allergy & Precautions, NPO status , Patient's Chart, lab work & pertinent test results, reviewed documented beta blocker date and time   Airway Mallampati: III  TM Distance: >3 FB Neck ROM: Full    Dental  (+) Teeth Intact, Dental Advisory Given   Pulmonary sleep apnea (noncompliant w/ CPAP)    Pulmonary exam normal breath sounds clear to auscultation       Cardiovascular hypertension (118/55 preop), Pt. on medications and Pt. on home beta blockers + CAD and + CABG (s/p CABG x 3 2003)  Normal cardiovascular exam Rhythm:Regular Rate:Normal  Stress test 2021  The left ventricular ejection fraction is hyperdynamic (>65%).  Nuclear stress EF: 68%.  There was no ST segment deviation noted during stress.  The study is normal.  This is a low risk study.   Normal stress nuclear study with no ischemia or infarction.  Gated ejection fraction 68% with normal wall motion.    Cath 2008 for chest pain, 1 graft occluded which was known since 2004. Other grafts in tact. LVEF 45% at the time w/ mod MR  Most recent echo on file 2013 normal LVEF, trace MR.     Neuro/Psych negative neurological ROS  negative psych ROS   GI/Hepatic negative GI ROS, Neg liver ROS,,,  Endo/Other  negative endocrine ROS    Renal/GU negative Renal ROS  negative genitourinary   Musculoskeletal negative musculoskeletal ROS (+)    Abdominal   Peds  Hematology negative hematology ROS (+)   Anesthesia Other Findings   Reproductive/Obstetrics negative OB ROS                             Anesthesia Physical Anesthesia Plan  ASA: 3  Anesthesia Plan: MAC and Regional   Post-op Pain Management: Tylenol PO (pre-op)*   Induction:   PONV Risk Score and Plan: 2 and Propofol infusion and TIVA  Airway Management Planned:  Natural Airway and Simple Face Mask  Additional Equipment: None  Intra-op Plan:   Post-operative Plan:   Informed Consent: I have reviewed the patients History and Physical, chart, labs and discussed the procedure including the risks, benefits and alternatives for the proposed anesthesia with the patient or authorized representative who has indicated his/her understanding and acceptance.       Plan Discussed with: CRNA  Anesthesia Plan Comments:         Anesthesia Quick Evaluation

## 2022-08-23 NOTE — Telephone Encounter (Signed)
Patient returned call

## 2022-08-23 NOTE — Telephone Encounter (Signed)
   Name: [name]  DOB: 1945/07/12  MRN: [id number]  Primary Cardiologist: Bryan Lemma, MD   Preoperative team, please contact this patient and set up a phone call appointment for further preoperative risk assessment. Please obtain consent and complete medication review. Thank you for your help.  I confirm that guidance regarding antiplatelet and oral anticoagulation therapy has been completed and, if necessary, noted below.  Per office protocol, if patient is without any new symptoms or concerns at the time of their virtual visit, she may hold Aspirin for 5-7 days prior to procedure. Please resume Aspirin as soon as possible postprocedure, at the discretion of the surgeon.     Joylene Grapes, NP 08/23/2022, 12:11 PM [facility]

## 2022-08-23 NOTE — Telephone Encounter (Signed)
Pt has been scheduled for tele pre op appt add on today per pre op app. Med rec and consent are done.

## 2022-08-23 NOTE — Telephone Encounter (Signed)
Left message to call back to schedule a tele pre op appt today or tomorrow per pre op APP. Will need to confirm if pt has been holding her ASA already.

## 2022-08-23 NOTE — Progress Notes (Signed)
Patient with planned surgery at [facility] with a significant cardiac history and taking asa.  Will need cards clearance and asa hold time. Message left with Dr. Percell Locus office.

## 2022-08-23 NOTE — Progress Notes (Signed)
Virtual Visit via Telephone Note   Because of [name]'s co-morbid illnesses, she is at least at moderate risk for complications without adequate follow up.  This format is felt to be most appropriate for this patient at this time.  The patient did not have access to video technology/had technical difficulties with video requiring transitioning to audio format only (telephone).  All issues noted in this document were discussed and addressed.  No physical exam could be performed with this format.  Please refer to the patient's chart for her consent to telehealth for [facility].  Evaluation Performed:  Preoperative cardiovascular risk assessment _____________   Date:  08/23/2022   Patient ID:  [name] Patient Location:  Home Provider location:   Office  Primary Care Provider:  Clovis Riley, L.August Saucer, MD Primary Cardiologist:  Bryan Lemma, MD  Chief Complaint / Patient Profile   77 y.o. y/o female with a h/o CAD s/p CABG x 3 in 2003, chronic bilateral lower extremity edema (likely venous stasis), hypertension, hyperlipidemia, and OSA who is pending  CLOSED vs OPEN REDUCTION AND INTERNAL FIXATION OF THUMB PROXIMAL PHALANX FRACTURE with Dr. Waylan Rocher of [facility] and presents today for telephonic preoperative cardiovascular risk assessment.  History of Present Illness    [name] is a 77 y.o. female who presents via audio/video conferencing for a telehealth visit today.  Pt was last seen in cardiology clinic on 01/07/2022 by Edd Fabian, NP.  At that time [name] was doing well.  The patient is now pending procedure as outlined above. Since her last visit, she has done well from a cardiac standpoint.  She denies chest pain, palpitations, dyspnea, pnd, orthopnea, n, v, dizziness, syncope, edema, weight gain, or early satiety. All other systems reviewed and are otherwise negative except as noted above.   Past Medical History     Past Medical History:  Diagnosis Date   Bilateral lower extremity edema - mild, likely venous stasis 05/16/2013   CAD (coronary artery disease) of bypass graft 2004/2008   Cath 2008: SVG-D1 Occluded with patent D1, LM 90%, Patent LIMA-LAD & SVG-OM1, patent Native RCA, EF ~45-50% & 2+ MR.; Myoview 2012: No ischemia or infarction, Normal EF; Echo 2013: Nl LV Fxn & normal valves    Essential hypertension    H/O unstable angina  2003   Cardiac cath with multivessel disease referred for CABG   Hyperlipidemia LDL goal <70    Hypertension, benign    Left main coronary artery disease 2003   70% ostial left main, 100% Cx,; patent, normal RCA   Leg edema, left 04/08/2015   OSA on CPAP    S/P CABG x 3 11/18/2001   LIMA-LAD; SVG-D1; SVG-Cx (Dr. Laneta Simmers)    Past Surgical History:  Procedure Laterality Date   CARDIAC CATHETERIZATION  01/2002   70% left main, 100 % Cx @ OM; normal RCA   CARDIAC CATHETERIZATION  2004, 2008   Occluded SVG-D1 with antegrade native D1 flow; LM increased from 70-90% with patent LIMA and SVG-OM1; patent RCA ; EF of 45-50% with 2+ MR    Carotid Duplex Ultrasound  09/2012   Less than 49% right carotid; relatively normal left carotid.   CORONARY ARTERY BYPASS GRAFT  01/2002   Dr. Laneta Simmers: LIMA-LAD, SVG-OM 1, SVG-D1   NM MYOVIEW LTD  11/26/2019   EF 65-70%.  No R WMA.  No ischemia or infarction.   TRANSTHORACIC ECHOCARDIOGRAM  2013   Normal  LV function with normal valves.    Allergies  Allergies  Allergen Reactions   Etodolac Anaphylaxis    migraines   Amlodipine     PATIENT NOT SURE THINKS LEG SWELLING. USE IN THE 1990'S   Atorvastatin Other (See Comments)   Motrin [Ibuprofen] Swelling    Throat swells   Simvastatin Other (See Comments)   Statins     Myalgias    Carvedilol Rash    Home Medications    Prior to Admission medications   Medication Sig Start Date End Date Taking? Authorizing Provider  aspirin EC 81 MG tablet Take 1 tablet (81 mg total) by  mouth daily. Swallow whole. 12/20/19   Marykay Lex, MD  cholecalciferol (VITAMIN D) 1000 UNITS tablet Take 1,000 Units by mouth daily.    [provider]  Coenzyme Q10-Vitamin E (QUNOL ULTRA COQ10 PO) Take 1 capsule by mouth daily.    [provider]  felodipine (PLENDIL) 2.5 MG 24 hr tablet TAKE 1 TABLET BY MOUTH EVERY DAY 01/07/21   Marykay Lex, MD  fexofenadine (ALLEGRA) 180 MG tablet Take 180 mg by mouth daily as needed for allergies or rhinitis.    [provider]  folic acid (FOLVITE) 400 MCG tablet Take 800 mcg by mouth daily.     [provider]  irbesartan-hydrochlorothiazide (AVALIDE) 300-12.5 MG per tablet Take 1 tablet by mouth daily.  10/29/13   [provider]  Magnesium 250 MG TABS Take 1 tablet by mouth daily.    [provider]  metoprolol succinate (TOPROL-XL) 100 MG 24 hr tablet TAKE 1 TABLET DAILY WITH OR IMMEDIATELY FOLLOWING A MEAL 02/24/22   Marykay Lex, MD  Multiple Vitamin (MULTIVITAMIN) capsule Take 1 capsule by mouth daily.    [provider]  nitroGLYCERIN (NITROSTAT) 0.4 MG SL tablet Place 1 tablet (0.4 mg total) under the tongue every 5 (five) minutes as needed for chest pain. 10/19/17 01/17/18  Rollene Rotunda, MD  Omega-3 Fatty Acids (FISH OIL) 1200 MG CAPS Take 3 capsules (3,600 mg total) by mouth daily. 12/22/16   Marykay Lex, MD  potassium chloride SA (KLOR-CON M20) 20 MEQ tablet Take 1 tablet (20 mEq total) by mouth daily. 04/01/22   Marykay Lex, MD  rosuvastatin (CRESTOR) 40 MG tablet ALTERNATING EVERY OTHER DAY TAKING 20 MG WITH 40 MG (PATIENT CAN SPLIT THE TABLETS FOR ALTERNATING DAYS) 03/08/22   Marykay Lex, MD    Physical Exam    Vital Signs:  [name] does not have vital signs available for review today.  Given telephonic nature of communication, physical exam is limited. AAOx3. NAD. Normal affect.  Speech and respirations are unlabored.  Accessory Clinical  Findings    None  Assessment & Plan    1.  Preoperative Cardiovascular Risk Assessment:  According to the Revised Cardiac Risk Index (RCRI), her Perioperative Risk of Major Cardiac Event is (%): 0.9. Her Functional Capacity in METs is: 8.97 according to the Duke Activity Status Index (DASI).Therefore, based on ACC/AHA guidelines, patient would be at acceptable risk for the planned procedure without further cardiovascular testing.  The patient was advised that if she develops new symptoms prior to surgery to contact our office to arrange for a follow-up visit, and she verbalized understanding.  Regarding ASA therapy, we recommend continuation of ASA throughout the perioperative period.  However, if the surgeon feels that cessation of ASA is required in the perioperative period, it may be stopped 5-7 days prior  to surgery with a plan to resume it as soon as felt to be feasible from a surgical standpoint in the post-operative period. Please note, patient has not held her aspirin prior to today.  Surgical clearance request was received this morning.  I advised her I would notify the surgeons of this.   A copy of this note will be routed to requesting surgeon.  Time:   Today, I have spent 6 minutes with the patient with telehealth technology discussing medical history, symptoms, and management plan.     Joylene Grapes, NP  08/23/2022, 4:27 PM

## 2022-08-23 NOTE — Telephone Encounter (Signed)
Pt has been scheduled for tele pre op appt add on today per pre op app. Med rec and consent are done.      Patient Consent for Virtual Visit        [name] has provided verbal consent on 08/23/2022 for a virtual visit (video or telephone).   CONSENT FOR VIRTUAL VISIT FOR:  [name]  By participating in this virtual visit I agree to the following:  I hereby voluntarily request, consent and authorize [facility] and its employed or contracted physicians, physician assistants, nurse practitioners or other licensed health care professionals (the Practitioner), to provide me with telemedicine health care services (the "Services") as deemed necessary by the treating Practitioner. I acknowledge and consent to receive the Services by the Practitioner via telemedicine. I understand that the telemedicine visit will involve communicating with the Practitioner through live audiovisual communication technology and the disclosure of certain medical information by electronic transmission. I acknowledge that I have been given the opportunity to request an in-person assessment or other available alternative prior to the telemedicine visit and am voluntarily participating in the telemedicine visit.  I understand that I have the right to withhold or withdraw my consent to the use of telemedicine in the course of my care at any time, without affecting my right to future care or treatment, and that the Practitioner or I may terminate the telemedicine visit at any time. I understand that I have the right to inspect all information obtained and/or recorded in the course of the telemedicine visit and may receive copies of available information for a reasonable fee.  I understand that some of the potential risks of receiving the Services via telemedicine include:  Delay or interruption in medical evaluation due to technological equipment failure or disruption; Information transmitted may not be  sufficient (e.g. poor resolution of images) to allow for appropriate medical decision making by the Practitioner; and/or  In rare instances, security protocols could fail, causing a breach of personal health information.  Furthermore, I acknowledge that it is my responsibility to provide information about my medical history, conditions and care that is complete and accurate to the best of my ability. I acknowledge that Practitioner's advice, recommendations, and/or decision may be based on factors not within their control, such as incomplete or inaccurate data provided by me or distortions of diagnostic images or specimens that may result from electronic transmissions. I understand that the practice of medicine is not an exact science and that Practitioner makes no warranties or guarantees regarding treatment outcomes. I acknowledge that a copy of this consent can be made available to me via my patient portal [facility] MyChart), or I can request a printed copy by calling the office of [facility].    I understand that my insurance will be billed for this visit.   I have read or had this consent read to me. I understand the contents of this consent, which adequately explains the benefits and risks of the Services being provided via telemedicine.  I have been provided ample opportunity to ask questions regarding this consent and the Services and have had my questions answered to my satisfaction. I give my informed consent for the services to be provided through the use of telemedicine in my medical care

## 2022-08-24 ENCOUNTER — Other Ambulatory Visit: Payer: Self-pay

## 2022-08-24 ENCOUNTER — Encounter (HOSPITAL_BASED_OUTPATIENT_CLINIC_OR_DEPARTMENT_OTHER): Payer: Self-pay | Admitting: Orthopedic Surgery

## 2022-08-24 ENCOUNTER — Encounter (HOSPITAL_BASED_OUTPATIENT_CLINIC_OR_DEPARTMENT_OTHER)
Admission: RE | Admit: 2022-08-24 | Discharge: 2022-08-24 | Disposition: A | Discharge status: Home / Self Care | Payer: Medicare Other | Source: Ambulatory Visit | Attending: Orthopedic Surgery | Admitting: Orthopedic Surgery

## 2022-08-24 DIAGNOSIS — S62512A Displaced fracture of proximal phalanx of left thumb, initial encounter for closed fracture: Secondary | ICD-10-CM | POA: Diagnosis present

## 2022-08-24 DIAGNOSIS — Z01818 Encounter for other preprocedural examination: Secondary | ICD-10-CM | POA: Insufficient documentation

## 2022-08-24 DIAGNOSIS — I10 Essential (primary) hypertension: Secondary | ICD-10-CM | POA: Diagnosis not present

## 2022-08-24 DIAGNOSIS — I2510 Atherosclerotic heart disease of native coronary artery without angina pectoris: Secondary | ICD-10-CM | POA: Diagnosis not present

## 2022-08-24 DIAGNOSIS — Z951 Presence of aortocoronary bypass graft: Secondary | ICD-10-CM | POA: Diagnosis not present

## 2022-08-24 LAB — BASIC METABOLIC PANEL
Anion gap: 11 (ref 5–15)
BUN: 22 mg/dL (ref 8–23)
CO2: 27 mmol/L (ref 22–32)
Calcium: 9.8 mg/dL (ref 8.9–10.3)
Chloride: 103 mmol/L (ref 98–111)
Creatinine, Ser: 0.87 mg/dL (ref 0.44–1.00)
GFR, Estimated: >60 mL/min (ref >60)
Glucose, Bld: 116 mg/dL — ABNORMAL HIGH (ref 70–99)
Potassium: 4.5 mmol/L (ref 3.5–5.1)
Sodium: 141 mmol/L (ref 135–145)

## 2022-08-24 NOTE — Progress Notes (Signed)

## 2022-08-25 ENCOUNTER — Ambulatory Visit (HOSPITAL_BASED_OUTPATIENT_CLINIC_OR_DEPARTMENT_OTHER): Payer: Medicare Other | Admitting: Anesthesiology

## 2022-08-25 ENCOUNTER — Encounter (HOSPITAL_BASED_OUTPATIENT_CLINIC_OR_DEPARTMENT_OTHER)
Admission: RE | Disposition: A | Discharge status: Home / Self Care | Payer: Self-pay | Source: Home / Self Care | Attending: Orthopedic Surgery

## 2022-08-25 ENCOUNTER — Ambulatory Visit (HOSPITAL_BASED_OUTPATIENT_CLINIC_OR_DEPARTMENT_OTHER)
Admission: RE | Admit: 2022-08-25 | Discharge: 2022-08-25 | Disposition: A | Discharge status: Home / Self Care | Payer: Medicare Other | Attending: Orthopedic Surgery | Admitting: Orthopedic Surgery

## 2022-08-25 ENCOUNTER — Encounter (HOSPITAL_BASED_OUTPATIENT_CLINIC_OR_DEPARTMENT_OTHER): Payer: Self-pay | Admitting: Orthopedic Surgery

## 2022-08-25 ENCOUNTER — Ambulatory Visit (HOSPITAL_BASED_OUTPATIENT_CLINIC_OR_DEPARTMENT_OTHER): Payer: Medicare Other

## 2022-08-25 DIAGNOSIS — I2510 Atherosclerotic heart disease of native coronary artery without angina pectoris: Secondary | ICD-10-CM | POA: Insufficient documentation

## 2022-08-25 DIAGNOSIS — Z951 Presence of aortocoronary bypass graft: Secondary | ICD-10-CM | POA: Insufficient documentation

## 2022-08-25 DIAGNOSIS — I10 Essential (primary) hypertension: Secondary | ICD-10-CM | POA: Insufficient documentation

## 2022-08-25 DIAGNOSIS — Z79899 Other long term (current) drug therapy: Secondary | ICD-10-CM

## 2022-08-25 DIAGNOSIS — G4730 Sleep apnea, unspecified: Secondary | ICD-10-CM

## 2022-08-25 DIAGNOSIS — Z01818 Encounter for other preprocedural examination: Secondary | ICD-10-CM

## 2022-08-25 DIAGNOSIS — S62512A Displaced fracture of proximal phalanx of left thumb, initial encounter for closed fracture: Secondary | ICD-10-CM | POA: Diagnosis not present

## 2022-08-25 HISTORY — PX: FINGER CLOSED REDUCTION: SHX1633

## 2022-08-25 SURGERY — CLOSED REDUCTION, FRACTURE, METACARPAL BONE
Anesthesia: Monitor Anesthesia Care | Site: Finger | Laterality: Left

## 2022-08-25 MED ORDER — LIDOCAINE 2% (20 MG/ML) 5 ML SYRINGE
INTRAMUSCULAR | Status: DC | PRN
  Administered 2022-08-25: 20 mg via INTRAVENOUS

## 2022-08-25 MED ORDER — MIDAZOLAM HCL 2 MG/2ML IJ SOLN
INTRAMUSCULAR | Status: AC
  Filled 2022-08-25: qty 2

## 2022-08-25 MED ORDER — OXYCODONE HCL 5 MG PO TABS: 5.0000 mg | ORAL_TABLET | Freq: Once | ORAL | Status: DC | PRN

## 2022-08-25 MED ORDER — MIDAZOLAM HCL 2 MG/2ML IJ SOLN
1.0000 mg | Freq: Once | INTRAMUSCULAR | Status: AC
  Administered 2022-08-25: 1 mg via INTRAVENOUS

## 2022-08-25 MED ORDER — ONDANSETRON HCL 4 MG/2ML IJ SOLN: 4.0000 mg | Freq: Once | INTRAMUSCULAR | Status: DC | PRN

## 2022-08-25 MED ORDER — ACETAMINOPHEN 500 MG PO TABS
1000.0000 mg | ORAL_TABLET | Freq: Once | ORAL | Status: AC
  Administered 2022-08-25: 1000 mg via ORAL

## 2022-08-25 MED ORDER — LIDOCAINE 2% (20 MG/ML) 5 ML SYRINGE
INTRAMUSCULAR | Status: AC
  Filled 2022-08-25: qty 5

## 2022-08-25 MED ORDER — CEFAZOLIN SODIUM-DEXTROSE 2-4 GM/100ML-% IV SOLN
2.0000 g | INTRAVENOUS | Status: AC
  Administered 2022-08-25: 2 g via INTRAVENOUS

## 2022-08-25 MED ORDER — FENTANYL CITRATE (PF) 100 MCG/2ML IJ SOLN
50.0000 ug | Freq: Once | INTRAMUSCULAR | Status: AC
  Administered 2022-08-25: 50 ug via INTRAVENOUS

## 2022-08-25 MED ORDER — FENTANYL CITRATE (PF) 100 MCG/2ML IJ SOLN: 25.0000 ug | INTRAMUSCULAR | Status: DC | PRN

## 2022-08-25 MED ORDER — ACETAMINOPHEN 500 MG PO TABS
ORAL_TABLET | ORAL | Status: AC
  Filled 2022-08-25: qty 2

## 2022-08-25 MED ORDER — PROPOFOL 500 MG/50ML IV EMUL
INTRAVENOUS | Status: DC | PRN
  Administered 2022-08-25: 75 ug/kg/min via INTRAVENOUS

## 2022-08-25 MED ORDER — FENTANYL CITRATE (PF) 100 MCG/2ML IJ SOLN
INTRAMUSCULAR | Status: AC
  Filled 2022-08-25: qty 2

## 2022-08-25 MED ORDER — 0.9 % SODIUM CHLORIDE (POUR BTL) OPTIME
TOPICAL | Status: DC | PRN
  Administered 2022-08-25: 300 mL

## 2022-08-25 MED ORDER — OXYCODONE HCL 5 MG/5ML PO SOLN: 5.0000 mg | Freq: Once | ORAL | Status: DC | PRN

## 2022-08-25 MED ORDER — LACTATED RINGERS IV SOLN: INTRAVENOUS | Status: DC

## 2022-08-25 MED ORDER — AMISULPRIDE (ANTIEMETIC) 5 MG/2ML IV SOLN: 10.0000 mg | Freq: Once | INTRAVENOUS | Status: DC | PRN

## 2022-08-25 MED ORDER — CEFAZOLIN SODIUM-DEXTROSE 2-4 GM/100ML-% IV SOLN
INTRAVENOUS | Status: AC
  Filled 2022-08-25: qty 100

## 2022-08-25 MED ORDER — PROPOFOL 10 MG/ML IV BOLUS
INTRAVENOUS | Status: DC | PRN
  Administered 2022-08-25 (×2): 20 mg via INTRAVENOUS

## 2022-08-25 MED ORDER — OXYCODONE HCL 5 MG PO TABS: 5.0000 mg | ORAL_TABLET | Freq: Four times a day (QID) | ORAL | 0 refills | Status: AC | PRN

## 2022-08-25 SURGICAL SUPPLY — 41 items
APL PRP STRL LF DISP 70% ISPRP (MISCELLANEOUS) ×2
BLADE SURG 15 STRL LF DISP TIS (BLADE) ×2 IMPLANT
BLADE SURG 15 STRL SS (BLADE) ×2
BNDG CMPR 5X3 KNIT ELC UNQ LF (GAUZE/BANDAGES/DRESSINGS) ×2
BNDG CMPR 9X4 STRL LF SNTH (GAUZE/BANDAGES/DRESSINGS) ×2
BNDG COHESIVE 1X5 TAN STRL LF (GAUZE/BANDAGES/DRESSINGS) IMPLANT
BNDG ELASTIC 3INX 5YD STR LF (GAUZE/BANDAGES/DRESSINGS) ×2 IMPLANT
BNDG ESMARK 4X9 LF (GAUZE/BANDAGES/DRESSINGS) ×2 IMPLANT
BNDG GAUZE DERMACEA FLUFF 4 (GAUZE/BANDAGES/DRESSINGS) ×2 IMPLANT
BNDG GZE DERMACEA 4 6PLY (GAUZE/BANDAGES/DRESSINGS) ×2
CHLORAPREP W/TINT 26 (MISCELLANEOUS) ×2 IMPLANT
CORD BIPOLAR FORCEPS 12FT (ELECTRODE) ×2 IMPLANT
COVER BACK TABLE 60X90IN (DRAPES) ×2 IMPLANT
CUFF TOURN SGL QUICK 18X4 (TOURNIQUET CUFF) ×2 IMPLANT
DRAPE EXTREMITY T 121X128X90 (DISPOSABLE) ×2 IMPLANT
DRAPE OEC MINIVIEW 54X84 (DRAPES) ×2 IMPLANT
DRAPE SURG 17X23 STRL (DRAPES) ×2 IMPLANT
DRSG TEGADERM 4X4.75 (GAUZE/BANDAGES/DRESSINGS) ×2 IMPLANT
GAUZE SPONGE 4X4 12PLY STRL (GAUZE/BANDAGES/DRESSINGS) IMPLANT
GAUZE XEROFORM 1X8 LF (GAUZE/BANDAGES/DRESSINGS) ×2 IMPLANT
GLOVE BIO SURGEON STRL SZ7 (GLOVE) ×2 IMPLANT
GOWN STRL REUS W/ TWL LRG LVL3 (GOWN DISPOSABLE) ×4 IMPLANT
GOWN STRL REUS W/TWL LRG LVL3 (GOWN DISPOSABLE) ×4
GUIDEWIRE ORTHO MINI ACTK .045 (WIRE) ×2 IMPLANT
NS IRRIG 1000ML POUR BTL (IV SOLUTION) ×2 IMPLANT
PACK BASIN DAY SURGERY FS (CUSTOM PROCEDURE TRAY) ×2 IMPLANT
PAD CAST 3X4 CTTN HI CHSV (CAST SUPPLIES) IMPLANT
PADDING CAST COTTON 3X4 STRL (CAST SUPPLIES)
SHEET MEDIUM DRAPE 40X70 STRL (DRAPES) ×2 IMPLANT
SLEEVE SCD COMPRESS KNEE MED (STOCKING) IMPLANT
SPLINT FIBERGLASS 4X30 (CAST SUPPLIES) ×1 IMPLANT
SPLINT PLASTER CAST XFAST 4X15 (CAST SUPPLIES) ×20 IMPLANT
SPONGE T-LAP 4X18 ~~LOC~~+RFID (SPONGE) ×1 IMPLANT
SUCTION TUBE FRAZIER 10FR DISP (SUCTIONS) IMPLANT
SUT ETHILON 4 0 PS 2 18 (SUTURE) ×2 IMPLANT
SUT MNCRL AB 3-0 PS2 18 (SUTURE) ×2 IMPLANT
SUT VIC AB 4-0 PS2 18 (SUTURE) IMPLANT
SYR BULB EAR ULCER 3OZ GRN STR (SYRINGE) IMPLANT
SYR CONTROL 10ML LL (SYRINGE) IMPLANT
TOWEL GREEN STERILE FF (TOWEL DISPOSABLE) ×4 IMPLANT
UNDERPAD 30X36 HEAVY ABSORB (UNDERPADS AND DIAPERS) ×2 IMPLANT

## 2022-08-25 NOTE — Transfer of Care (Signed)
Immediate Anesthesia Transfer of Care Note  Patient: [name]  Procedure(s) Performed: CLOSED VERSUS OPEN REDUCTION AND INTERNAL FIXATION OF THUMB PROXIMAL PHALANX FRACTURE (Left: Finger) OPEN REDUCTION INTERNAL FIXATION (ORIF) FINGER (Left)  Patient Location: PACU  Anesthesia Type:MAC combined with regional for post-op pain  Level of Consciousness: drowsy, patient cooperative, and responds to stimulation  Airway & Oxygen Therapy: Patient Spontanous Breathing and Patient connected to face mask oxygen  Post-op Assessment: Report given to RN and Post -op Vital signs reviewed and stable  Post vital signs: Reviewed and stable  Last Vitals:  Vitals Value Taken Time  BP    Temp    Pulse 62 08/25/22 1210  Resp    SpO2 98 % 08/25/22 1210  Vitals shown include unvalidated device data.  Last Pain:  Vitals:   08/25/22 0846  TempSrc: Temporal  PainSc: 0-No pain         Complications: No notable events documented.
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
## 2022-08-25 NOTE — Discharge Instructions (Addendum)
  Charles Benfield, M.D. Hand Surgery  POST-OPERATIVE DISCHARGE INSTRUCTIONS   PRESCRIPTIONS: You may have been given a prescription to be taken as directed for post-operative pain control.  You may also take over the counter ibuprofen/aleve and tylenol for pain. Take this as directed on the packaging. Do not exceed 3000 mg tylenol/acetaminophen in 24 hours.  Ibuprofen 600-800 mg (3-4) tablets by mouth every 6 hours as needed for pain.  OR Aleve 2 tablets by mouth every 12 hours (twice daily) as needed for pain.  AND/OR Tylenol 1000 mg (2 tablets) every 8 hours as needed for pain.  Please use your pain medication carefully, as refills are limited and you may not be provided with one.  As stated above, please use over the counter pain medicine - it will also be helpful with decreasing your swelling.    ANESTHESIA: After your surgery, post-surgical discomfort or pain is likely. This discomfort can last several days to a few weeks. At certain times of the day your discomfort may be more intense.   Did you receive a nerve block?  A nerve block can provide pain relief for one hour to two days after your surgery. As long as the nerve block is working, you will experience little or no sensation in the area the surgeon operated on.  As the nerve block wears off, you will begin to experience pain or discomfort. It is very important that you begin taking your prescribed pain medication before the nerve block fully wears off. Treating your pain at the first sign of the block wearing off will ensure your pain is better controlled and more tolerable when full-sensation returns. Do not wait until the pain is intolerable, as the medicine will be less effective. It is better to treat pain in advance than to try and catch up.   General Anesthesia:  If you did not receive a nerve block during your surgery, you will need to start taking your pain medication shortly after your surgery and should continue  to do so as prescribed by your surgeon.     ICE AND ELEVATION: You may use ice for the first 48-72 hours, but it is not critical.   Motion of your fingers is very important to decrease the swelling.  Elevation, as much as possible for the next 48 hours, is critical for decreasing swelling as well as for pain relief. Elevation means when you are seated or lying down, you hand should be at or above your heart. When walking, the hand needs to be at or above the level of your elbow.  If the bandage gets too tight, it may need to be loosened. Please contact our office and we will instruct you in how to do this.    SURGICAL BANDAGES:  Keep your dressing and/or splint clean and dry at all times.  Do not remove until you are seen again in the office.  If careful, you may place a plastic bag over your bandage and tape the end to shower, but be careful, do not get your bandages wet.     HAND THERAPY:  You may not need any. If you do, we will begin this at your follow up visit in the clinic.    ACTIVITY AND WORK: You are encouraged to move any fingers which are not in the bandage.  Light use of the fingers is allowed to assist the other hand with daily hygiene and eating, but strong gripping or lifting is often uncomfortable and   should be avoided.  You might miss a variable period of time from work and hopefully this issue has been discussed prior to surgery. You may not do any heavy work with your affected hand for about 2 weeks.    [facility] Second Floor, [address] [place], [place] [address] [phone]   You may have Tylenol after 2:45pm today, if needed.   Post Anesthesia Home Care Instructions  Activity: Get plenty of rest for the remainder of the day. A responsible individual must stay with you for 24 hours following the procedure.  For the next 24 hours, DO NOT: -Drive a car -Advertising copywriter -Drink alcoholic beverages -Take any medication unless instructed by  your physician -Make any legal decisions or sign important papers.  Meals: Start with liquid foods such as gelatin or soup. Progress to regular foods as tolerated. Avoid greasy, spicy, heavy foods. If nausea and/or vomiting occur, drink only clear liquids until the nausea and/or vomiting subsides. Call your physician if vomiting continues.  Special Instructions/Symptoms: Your throat may feel dry or sore from the anesthesia or the breathing tube placed in your throat during surgery. If this causes discomfort, gargle with warm salt water. The discomfort should disappear within 24 hours.  If you had a scopolamine patch placed behind your ear for the management of post- operative nausea and/or vomiting:  1. The medication in the patch is effective for 72 hours, after which it should be removed.  Wrap patch in a tissue and discard in the trash. Wash hands thoroughly with soap and water. 2. You may remove the patch earlier than 72 hours if you experience unpleasant side effects which may include dry mouth, dizziness or visual disturbances. 3. Avoid touching the patch. Wash your hands with soap and water after contact with the patch.   Regional Anesthesia Blocks  1. Numbness or the inability to move the "blocked" extremity may last from 3-48 hours after placement. The length of time depends on the medication injected and your individual response to the medication. If the numbness is not going away after 48 hours, call your surgeon.  2. The extremity that is blocked will need to be protected until the numbness is gone and the  Strength has returned. Because you cannot feel it, you will need to take extra care to avoid injury. Because it may be weak, you may have difficulty moving it or using it. You may not know what position it is in without looking at it while the block is in effect.  3. For blocks in the legs and feet, returning to weight bearing and walking needs to be done carefully. You will need  to wait until the numbness is entirely gone and the strength has returned. You should be able to move your leg and foot normally before you try and bear weight or walk. You will need someone to be with you when you first try to ensure you do not fall and possibly risk injury.  4. Bruising and tenderness at the needle site are common side effects and will resolve in a few days.  5. Persistent numbness or new problems with movement should be communicated to the surgeon or the [facility] [phone] [facility] [phone]).

## 2022-08-25 NOTE — Anesthesia Procedure Notes (Signed)
Procedure Name: MAC Date/Time: 08/25/2022 11:00 AM  Performed by: Thornell Mule, CRNAOxygen Delivery Method: Simple face mask

## 2022-08-25 NOTE — Progress Notes (Signed)
Assisted Dr. Finucane with left, supraclavicular, ultrasound guided block. Side rails up, monitors on throughout procedure. See vital signs in flow sheet. Tolerated Procedure well. 
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
## 2022-08-25 NOTE — Interval H&P Note (Signed)
History and Physical Interval Note:  08/25/2022 9:21 AM  [name]  has presented today for surgery, with the diagnosis of Fracture proximal phalanx of thumb.  The various methods of treatment have been discussed with the patient and family. After consideration of risks, benefits and other options for treatment, the patient has consented to  Procedure(s) with comments: CLOSED VERSUS OPEN REDUCTION AND INTERNAL FIXATION OF THUMB PROXIMAL PHALANX FRACTURE (Left) - REGIONAL AND MAC  100 MIN OPEN REDUCTION INTERNAL FIXATION (ORIF) FINGER (Left) as a surgical intervention.  The patient's history has been reviewed, patient examined, no change in status, stable for surgery.  I have reviewed the patient's chart and labs.  Questions were answered to the patient's satisfaction.     Ashtin Melichar Leshonda Galambos

## 2022-08-25 NOTE — Anesthesia Postprocedure Evaluation (Signed)
Anesthesia Post Note  Patient: HENLEE LANDECK  Procedure(s) Performed: CLOSED  REDUCTION  OF THUMB PROXIMAL PHALANX FRACTURE (Left: Finger)     Patient location during evaluation: PACU Anesthesia Type: Regional and MAC Level of consciousness: awake and alert Pain management: pain level controlled Vital Signs Assessment: post-procedure vital signs reviewed and stable Respiratory status: spontaneous breathing, nonlabored ventilation and respiratory function stable Cardiovascular status: blood pressure returned to baseline and stable Postop Assessment: no apparent nausea or vomiting Anesthetic complications: no   No notable events documented.  Last Vitals:  Vitals:   08/25/22 1230 08/25/22 1256  BP: (!) 124/59 125/65  Pulse: (!) 57 70  Resp: 13 18  Temp:  (!) 36.2 C  SpO2: 95% 96%    Last Pain:  Vitals:   08/25/22 1256  TempSrc:   PainSc: 0-No pain                 Lannie Fields

## 2022-08-26 ENCOUNTER — Encounter (HOSPITAL_BASED_OUTPATIENT_CLINIC_OR_DEPARTMENT_OTHER): Payer: Self-pay | Admitting: Orthopedic Surgery
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
## 2022-10-05 ENCOUNTER — Other Ambulatory Visit: Payer: Self-pay

## 2022-10-05 NOTE — Progress Notes (Signed)
   10/05/22 1441  Pre-op Phone Call  Surgery Date Verified 10/13/22  Arrival Time Verified 0900  Surgery Location Verified [facility]  Medical History Reviewed Yes  Is the patient taking a GLP-1 receptor agonist? No  Does the patient have diabetes? No diagnosis of diabetes  Do you have a history of heart problems? Yes  Cardiologist Name Herbie Baltimore  Have you ever had tests on your heart? Yes  What cardiac tests were performed? EKG;Labs  Does patient have other implanted devices? No  Patient Teaching Enhanced Recovery;Pre / Post Procedure  Patient educated about smoking cessation 24 hours prior to surgery. N/A Non-Smoker  Patient verbalizes understanding of bowel prep? N/A  THA/TKA patients only:  By your surgery date, will you have been taking narcotics for 90 days or greater? No  Med Rec Completed Yes  Take the Following Meds the Morning of Surgery Metoprolol, felodipine  Recent  Lab Work, EKG, CXR? Yes  NPO (Including gum & candy) After midnight  Allowed clear liquids Water;Gatorade  (diabetics please choose diet or no sugar options)  Patient instructed to stop clear liquids including Carb loading drink at: 0700  Stop Solids, Milk, Candy, and Gum STARTING AT MIDNIGHT  Responsible adult to drive and be with you for 24 hours? Yes  Name & Phone Number for Ride/Caregiver Husband  No Jewelry, money, nail polish or make-up.  No lotions, powders, perfumes. No shaving  48 hrs. prior to surgery. Yes  Contacts, Dentures & Glasses Will Have to be Removed Before OR. Yes  Please bring your ID and Insurance Card the morning of your surgery. (Surgery Centers Only) Yes  Bring any papers or x-rays with you that your surgeon gave you. Yes  Instructed to contact the location of procedure/ provider if they or anyone in their household develops symptoms or tests positive for COVID-19, has close contact with someone who tests positive for COVID, or has known exposure to any contagious illness. Yes  Call this  number the morning of surgery  with any problems that may cancel your surgery. [phone]  Covid-19 Assessment  Have you had a positive COVID-19 test within the previous 90 days? No  COVID Testing Guidance Proceed with the additional questions.  Patient's surgery required a COVID-19 test (cardiothoracic, complex ENT, and bronchoscopies/ EBUS) No  Have you been unmasked and in close contact with anyone with COVID-19 or COVID-19 symptoms within the past 10 days? No  Do you or anyone in your household currently have any COVID-19 symptoms? No

## 2022-10-06 ENCOUNTER — Encounter: Payer: Self-pay | Admitting: Physician Assistant

## 2022-10-06 ENCOUNTER — Ambulatory Visit: Payer: Medicare Other | Attending: Physician Assistant | Admitting: Physician Assistant

## 2022-10-06 VITALS — BP 136/74 | HR 63 | Ht 63.0 in | Wt 140.6 lb

## 2022-10-06 DIAGNOSIS — I10 Essential (primary) hypertension: Secondary | ICD-10-CM

## 2022-10-06 DIAGNOSIS — R600 Localized edema: Secondary | ICD-10-CM

## 2022-10-06 DIAGNOSIS — I25810 Atherosclerosis of coronary artery bypass graft(s) without angina pectoris: Secondary | ICD-10-CM | POA: Diagnosis not present

## 2022-10-06 DIAGNOSIS — E785 Hyperlipidemia, unspecified: Secondary | ICD-10-CM

## 2022-10-06 MED ORDER — NITROGLYCERIN 0.4 MG SL SUBL: 0.4000 mg | SUBLINGUAL_TABLET | SUBLINGUAL | 4 refills | Status: DC | PRN

## 2022-10-06 MED ORDER — ROSUVASTATIN CALCIUM 40 MG PO TABS: 40.0000 mg | ORAL_TABLET | Freq: Every day | ORAL | 3 refills | Status: DC

## 2022-10-06 MED ORDER — METOPROLOL SUCCINATE ER 50 MG PO TB24: 50.0000 mg | ORAL_TABLET | Freq: Every day | ORAL | 3 refills | Status: DC

## 2022-10-06 NOTE — Patient Instructions (Signed)
Medication Instructions:  Increase Crestor to 40 mg ( Take 1 Tablet Daily). Decrease Metoprolol from 100 mg to 50 mg ( Take 1 50 mg Tablet Daily). *If you need a refill on your cardiac medications before your next appointment, please call your pharmacy*   Lab Work: Lipid  Panel : 2 months If you have labs (blood work) drawn today and your tests are completely normal, you will receive your results only by: MyChart Message (if you have MyChart) OR A paper copy in the mail If you have any lab test that is abnormal or we need to change your treatment, we will call you to review the results.   Testing/Procedures: No Testing   Follow-Up: At [facility], you and your health needs are our priority.  As part of our continuing mission to provide you with exceptional heart care, we have created designated Provider Care Teams.  These Care Teams include your primary Cardiologist (physician) and Advanced Practice Providers (APPs -  Physician Assistants and Nurse Practitioners) who all work together to provide you with the care you need, when you need it.  We recommend signing up for the patient portal called "MyChart".  Sign up information is provided on this After Visit Summary.  MyChart is used to connect with patients for Virtual Visits (Telemedicine).  Patients are able to view lab/test results, encounter notes, upcoming appointments, etc.  Non-urgent messages can be sent to your provider as well.   To learn more about what you can do with MyChart, go to [email or web address].    Your next appointment:   6 month(s)  Provider:   Bryan Lemma, MD

## 2022-10-13 ENCOUNTER — Ambulatory Visit (HOSPITAL_BASED_OUTPATIENT_CLINIC_OR_DEPARTMENT_OTHER): Payer: Medicare Other | Admitting: Anesthesiology

## 2022-10-13 ENCOUNTER — Other Ambulatory Visit: Payer: Self-pay

## 2022-10-13 ENCOUNTER — Ambulatory Visit (HOSPITAL_BASED_OUTPATIENT_CLINIC_OR_DEPARTMENT_OTHER)
Admission: RE | Admit: 2022-10-13 | Discharge: 2022-10-13 | Disposition: A | Discharge status: Home / Self Care | Payer: Medicare Other | Attending: Orthopedic Surgery | Admitting: Orthopedic Surgery

## 2022-10-13 ENCOUNTER — Telehealth: Payer: Self-pay

## 2022-10-13 ENCOUNTER — Encounter (HOSPITAL_BASED_OUTPATIENT_CLINIC_OR_DEPARTMENT_OTHER): Payer: Self-pay | Admitting: Orthopedic Surgery

## 2022-10-13 ENCOUNTER — Encounter (HOSPITAL_BASED_OUTPATIENT_CLINIC_OR_DEPARTMENT_OTHER)
Admission: RE | Disposition: A | Discharge status: Home / Self Care | Payer: Self-pay | Source: Home / Self Care | Attending: Orthopedic Surgery

## 2022-10-13 ENCOUNTER — Ambulatory Visit (HOSPITAL_BASED_OUTPATIENT_CLINIC_OR_DEPARTMENT_OTHER): Payer: Medicare Other

## 2022-10-13 DIAGNOSIS — I2510 Atherosclerotic heart disease of native coronary artery without angina pectoris: Secondary | ICD-10-CM | POA: Diagnosis not present

## 2022-10-13 DIAGNOSIS — G4733 Obstructive sleep apnea (adult) (pediatric): Secondary | ICD-10-CM | POA: Insufficient documentation

## 2022-10-13 DIAGNOSIS — I10 Essential (primary) hypertension: Secondary | ICD-10-CM | POA: Diagnosis not present

## 2022-10-13 DIAGNOSIS — X58XXXD Exposure to other specified factors, subsequent encounter: Secondary | ICD-10-CM | POA: Insufficient documentation

## 2022-10-13 DIAGNOSIS — S62512A Displaced fracture of proximal phalanx of left thumb, initial encounter for closed fracture: Secondary | ICD-10-CM

## 2022-10-13 DIAGNOSIS — G4730 Sleep apnea, unspecified: Secondary | ICD-10-CM | POA: Diagnosis not present

## 2022-10-13 DIAGNOSIS — S62512D Displaced fracture of proximal phalanx of left thumb, subsequent encounter for fracture with routine healing: Secondary | ICD-10-CM | POA: Diagnosis present

## 2022-10-13 DIAGNOSIS — Z01818 Encounter for other preprocedural examination: Secondary | ICD-10-CM

## 2022-10-13 DIAGNOSIS — Z951 Presence of aortocoronary bypass graft: Secondary | ICD-10-CM | POA: Diagnosis not present

## 2022-10-13 DIAGNOSIS — Z79899 Other long term (current) drug therapy: Secondary | ICD-10-CM | POA: Diagnosis not present

## 2022-10-13 HISTORY — PX: MINOR HARDWARE REMOVAL: SHX6474

## 2022-10-13 SURGERY — MINOR HARDWARE REMOVAL
Anesthesia: Monitor Anesthesia Care | Site: Thumb | Laterality: Left

## 2022-10-13 MED ORDER — CEFAZOLIN SODIUM-DEXTROSE 2-4 GM/100ML-% IV SOLN
INTRAVENOUS | Status: AC
  Filled 2022-10-13: qty 100

## 2022-10-13 MED ORDER — BUPIVACAINE HCL (PF) 0.25 % IJ SOLN
INTRAMUSCULAR | Status: AC
  Filled 2022-10-13: qty 30

## 2022-10-13 MED ORDER — FENTANYL CITRATE (PF) 100 MCG/2ML IJ SOLN
INTRAMUSCULAR | Status: AC
  Filled 2022-10-13: qty 2

## 2022-10-13 MED ORDER — ACETAMINOPHEN 500 MG PO TABS
1000.0000 mg | ORAL_TABLET | Freq: Once | ORAL | Status: AC
  Administered 2022-10-13: 1000 mg via ORAL

## 2022-10-13 MED ORDER — CEFAZOLIN SODIUM-DEXTROSE 2-3 GM-%(50ML) IV SOLR
INTRAVENOUS | Status: DC | PRN
  Administered 2022-10-13: 2 g via INTRAVENOUS

## 2022-10-13 MED ORDER — PROPOFOL 500 MG/50ML IV EMUL
INTRAVENOUS | Status: DC | PRN
  Administered 2022-10-13: 100 ug/kg/min via INTRAVENOUS

## 2022-10-13 MED ORDER — FENTANYL CITRATE (PF) 250 MCG/5ML IJ SOLN
INTRAMUSCULAR | Status: DC | PRN
  Administered 2022-10-13: 25 ug via INTRAVENOUS
  Administered 2022-10-13: 50 ug via INTRAVENOUS
  Administered 2022-10-13: 25 ug via INTRAVENOUS

## 2022-10-13 MED ORDER — LIDOCAINE 2% (20 MG/ML) 5 ML SYRINGE
INTRAMUSCULAR | Status: AC
  Filled 2022-10-13: qty 10

## 2022-10-13 MED ORDER — ACETAMINOPHEN 500 MG PO TABS
ORAL_TABLET | ORAL | Status: AC
  Filled 2022-10-13: qty 2

## 2022-10-13 MED ORDER — DEXAMETHASONE SODIUM PHOSPHATE 10 MG/ML IJ SOLN
INTRAMUSCULAR | Status: AC
  Filled 2022-10-13: qty 3

## 2022-10-13 MED ORDER — FENTANYL CITRATE (PF) 100 MCG/2ML IJ SOLN: 25.0000 ug | INTRAMUSCULAR | Status: DC | PRN

## 2022-10-13 MED ORDER — CEFAZOLIN SODIUM-DEXTROSE 2-4 GM/100ML-% IV SOLN: 2.0000 g | INTRAVENOUS | Status: DC

## 2022-10-13 MED ORDER — ONDANSETRON HCL 4 MG/2ML IJ SOLN
INTRAMUSCULAR | Status: AC
  Filled 2022-10-13: qty 4

## 2022-10-13 MED ORDER — LACTATED RINGERS IV SOLN: INTRAVENOUS | Status: DC

## 2022-10-13 MED ORDER — BUPIVACAINE HCL (PF) 0.25 % IJ SOLN
INTRAMUSCULAR | Status: DC | PRN
  Administered 2022-10-13: 5 mL

## 2022-10-13 MED ORDER — HYDRALAZINE HCL 20 MG/ML IJ SOLN
INTRAMUSCULAR | Status: AC
  Filled 2022-10-13: qty 1

## 2022-10-13 MED ORDER — HYDRALAZINE HCL 20 MG/ML IJ SOLN: 5.0000 mg | Freq: Once | INTRAMUSCULAR | Status: DC

## 2022-10-13 MED ORDER — LIDOCAINE 2% (20 MG/ML) 5 ML SYRINGE
INTRAMUSCULAR | Status: DC | PRN
  Administered 2022-10-13: 80 mg via INTRAVENOUS

## 2022-10-13 MED ORDER — ONDANSETRON HCL 4 MG/2ML IJ SOLN
INTRAMUSCULAR | Status: DC | PRN
  Administered 2022-10-13: 4 mg via INTRAVENOUS

## 2022-10-13 SURGICAL SUPPLY — 41 items
APL PRP STRL LF DISP 70% ISPRP (MISCELLANEOUS) ×1
BLADE SURG 15 STRL LF DISP TIS (BLADE) ×1 IMPLANT
BLADE SURG 15 STRL SS (BLADE) ×1
BNDG CMPR 5X3 KNIT ELC UNQ LF (GAUZE/BANDAGES/DRESSINGS) ×1
BNDG CMPR 9X4 STRL LF SNTH (GAUZE/BANDAGES/DRESSINGS) ×1
BNDG ELASTIC 3INX 5YD STR LF (GAUZE/BANDAGES/DRESSINGS) ×1 IMPLANT
BNDG ESMARK 4X9 LF (GAUZE/BANDAGES/DRESSINGS) ×1 IMPLANT
BNDG GAUZE DERMACEA FLUFF 4 (GAUZE/BANDAGES/DRESSINGS) ×1 IMPLANT
BNDG GZE DERMACEA 4 6PLY (GAUZE/BANDAGES/DRESSINGS) ×1
BNDG PLASTER X FAST 3X3 WHT LF (CAST SUPPLIES) ×10 IMPLANT
BNDG PLSTR 9X3 FST ST WHT (CAST SUPPLIES) ×10
CHLORAPREP W/TINT 26 (MISCELLANEOUS) ×1 IMPLANT
CORD BIPOLAR FORCEPS 12FT (ELECTRODE) ×1 IMPLANT
COVER BACK TABLE 60X90IN (DRAPES) ×1 IMPLANT
CUFF TOURN SGL QUICK 18X4 (TOURNIQUET CUFF) ×1 IMPLANT
CUFF TOURN SGL QUICK 24 (TOURNIQUET CUFF)
CUFF TRNQT CYL 24X4X16.5-23 (TOURNIQUET CUFF) IMPLANT
DRAPE EXTREMITY T 121X128X90 (DISPOSABLE) ×1 IMPLANT
DRAPE OEC MINIVIEW 54X84 (DRAPES) ×1 IMPLANT
DRAPE SURG 17X23 STRL (DRAPES) ×1 IMPLANT
GAUZE SPONGE 4X4 12PLY STRL (GAUZE/BANDAGES/DRESSINGS) ×1 IMPLANT
GAUZE XEROFORM 1X8 LF (GAUZE/BANDAGES/DRESSINGS) IMPLANT
GLOVE BIO SURGEON STRL SZ7 (GLOVE) ×1 IMPLANT
GLOVE BIOGEL PI IND STRL 7.0 (GLOVE) ×1 IMPLANT
GOWN STRL REUS W/ TWL LRG LVL3 (GOWN DISPOSABLE) ×2 IMPLANT
GOWN STRL REUS W/TWL LRG LVL3 (GOWN DISPOSABLE) ×2
NDL HYPO 25X1 1.5 SAFETY (NEEDLE) IMPLANT
NEEDLE HYPO 25X1 1.5 SAFETY (NEEDLE)
NS IRRIG 1000ML POUR BTL (IV SOLUTION) ×1 IMPLANT
PACK BASIN DAY SURGERY FS (CUSTOM PROCEDURE TRAY) ×1 IMPLANT
PAD CAST 3X4 CTTN HI CHSV (CAST SUPPLIES) ×1 IMPLANT
PADDING CAST COTTON 3X4 STRL (CAST SUPPLIES) ×1
SHEET MEDIUM DRAPE 40X70 STRL (DRAPES) ×1 IMPLANT
SLEEVE SCD COMPRESS KNEE MED (STOCKING) IMPLANT
SUT ETHILON 4 0 PS 2 18 (SUTURE) ×1 IMPLANT
SUT MNCRL AB 3-0 PS2 18 (SUTURE) ×1 IMPLANT
SUT VIC AB 4-0 PS2 18 (SUTURE) IMPLANT
SYR BULB EAR ULCER 3OZ GRN STR (SYRINGE) ×1 IMPLANT
SYR CONTROL 10ML LL (SYRINGE) IMPLANT
TOWEL GREEN STERILE FF (TOWEL DISPOSABLE) ×2 IMPLANT
UNDERPAD 30X36 HEAVY ABSORB (UNDERPADS AND DIAPERS) ×1 IMPLANT

## 2022-10-13 NOTE — Anesthesia Preprocedure Evaluation (Addendum)
Anesthesia Evaluation  Patient identified by MRN, date of birth, ID band Patient awake    Reviewed: Allergy & Precautions, NPO status , Patient's Chart, lab work & pertinent test results, reviewed documented beta blocker date and time   Airway Mallampati: II  TM Distance: >3 FB Neck ROM: Full    Dental no notable dental hx. (+) Teeth Intact, Dental Advisory Given   Pulmonary sleep apnea (no CPAP)    Pulmonary exam normal breath sounds clear to auscultation       Cardiovascular hypertension, Pt. on medications and Pt. on home beta blockers + CAD and + CABG  Normal cardiovascular exam Rhythm:Regular Rate:Normal  Stress Test 2021  The left ventricular ejection fraction is hyperdynamic (>65%).  Nuclear stress EF: 68%.  There was no ST segment deviation noted during stress.  The study is normal.  This is a low risk study.   Normal stress nuclear study with no ischemia or infarction.  Gated ejection fraction 68% with normal wall motion.    Neuro/Psych negative neurological ROS  negative psych ROS   GI/Hepatic negative GI ROS, Neg liver ROS,,,  Endo/Other  negative endocrine ROS    Renal/GU negative Renal ROS  negative genitourinary   Musculoskeletal negative musculoskeletal ROS (+)    Abdominal   Peds  Hematology negative hematology ROS (+)   Anesthesia Other Findings   Reproductive/Obstetrics                             Anesthesia Physical Anesthesia Plan  ASA: 3  Anesthesia Plan: MAC   Post-op Pain Management: Tylenol PO (pre-op)*   Induction: Intravenous  PONV Risk Score and Plan: 2 and Propofol infusion, Treatment may vary due to age or medical condition, Dexamethasone and Ondansetron  Airway Management Planned: Natural Airway  Additional Equipment:   Intra-op Plan:   Post-operative Plan:   Informed Consent: I have reviewed the patients History and Physical, chart,  labs and discussed the procedure including the risks, benefits and alternatives for the proposed anesthesia with the patient or authorized representative who has indicated his/her understanding and acceptance.     Dental advisory given  Plan Discussed with: CRNA  Anesthesia Plan Comments:        Anesthesia Quick Evaluation

## 2022-10-13 NOTE — Transfer of Care (Signed)
Immediate Anesthesia Transfer of Care Note  Patient: [name]  Procedure(s) Performed: REMOVAL OF DEEP IMPLANT FROM THUMB (Left: Thumb)  Patient Location: PACU  Anesthesia Type:MAC  Level of Consciousness: awake and alert   Airway & Oxygen Therapy: Patient Spontanous Breathing  Post-op Assessment: Report given to RN and Post -op Vital signs reviewed and stable  Post vital signs: Reviewed and stable  Last Vitals:  Vitals Value Taken Time  BP 123/54 10/13/22 1145  Temp    Pulse 58 10/13/22 1146  Resp 15 10/13/22 1146  SpO2 97 % 10/13/22 1146  Vitals shown include unfiled device data.  Last Pain:  Vitals:   10/13/22 0901  TempSrc: Temporal  PainSc: 0-No pain         Complications: No notable events documented.

## 2022-10-13 NOTE — H&P (Signed)
HAND SURGERY   HPI: Patient is a 77 y.o. female who is 7 months status post closed duction and percutaneous pinning of a severely comminuted left thumb proximal phalanx fracture.  This was pinned with 2 cross K wires.  The K wires were cut beneath the skin.  She presents today for hardware removal.  Patient denies any changes to their medical history or new systemic symptoms today.    Past Medical History:  Diagnosis Date   Bilateral lower extremity edema - mild, likely venous stasis 05/16/2013   CAD (coronary artery disease) of bypass graft 2004/2008   Cath 2008: SVG-D1 Occluded with patent D1, LM 90%, Patent LIMA-LAD & SVG-OM1, patent Native RCA, EF ~45-50% & 2+ MR.; Myoview 2012: No ischemia or infarction, Normal EF; Echo 2013: Nl LV Fxn & normal valves    Essential hypertension    H/O unstable angina 2003   Cardiac cath with multivessel disease referred for CABG   Hyperlipidemia LDL goal <70    Hypertension, benign    Left main coronary artery disease 2003   70% ostial left main, 100% Cx,; patent, normal RCA   Leg edema, left 04/08/2015   OSA on CPAP    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 Laneta Simmers)    Past Surgical History:  Procedure Laterality Date   CARDIAC CATHETERIZATION  01/2002   70% left main, 100 % Cx @ OM; normal RCA   CARDIAC CATHETERIZATION  2004, 2008   Occluded SVG-D1 with antegrade native D1 flow; LM increased from 70-90% with patent LIMA and SVG-OM1; patent RCA ; EF of 45-50% with 2+ MR    Carotid Duplex Ultrasound  09/2012   Less than 49% right carotid; relatively normal left carotid.   CORONARY ARTERY BYPASS GRAFT  01/2002   Dr. Laneta Simmers: LIMA-LAD, SVG-OM 1, SVG-D1   FINGER CLOSED REDUCTION Left 08/25/2022   Procedure: CLOSED  REDUCTION  OF THUMB PROXIMAL PHALANX FRACTURE;  Surgeon: Marlyne Beards, MD;  Location: [facility];  Service: Orthopedics;  Laterality: Left;  REGIONAL AND MAC  100 MIN   NM MYOVIEW LTD  11/26/2019    EF 65-70%.  No R WMA.  No ischemia or infarction.   TRANSTHORACIC ECHOCARDIOGRAM  2013   Normal LV function with normal valves.   Social History   Socioeconomic History   Marital status: Married    Spouse name: Not on file   Number of children: Not on file   Years of education: Not on file   Highest education level: Not on file  Occupational History   Not on file  Tobacco Use   Smoking status: Never   Smokeless tobacco: Never  Vaping Use   Vaping status: Never Used  Substance and Sexual Activity   Alcohol use: No   Drug use: No   Sexual activity: Not on file  Other Topics Concern   Not on file  Social History Narrative   77 year old married white woman, mother of 3, grandmother of 4.   Never smoked. Rare social alcohol.   She is a Hotel manager around the house, and does lots of the chart work.   Her husband is also a former patient Dr. Alanda Amass, and now follows up with Dr. Herbie Baltimore.   Social Determinants of Health   Financial Resource Strain: Not on file  Food Insecurity: Not on file  Transportation Needs: Not on file  Physical Activity: Not on file  Stress: Not on file  Social Connections: Not on file  Family History  Problem Relation Age of Onset   Other Mother        Unknown   Other Father        Unknown   Breast cancer Neg Hx    - negative except otherwise stated in the family history section Allergies  Allergen Reactions   Etodolac Anaphylaxis    migraines   Motrin [Ibuprofen] Anaphylaxis and Swelling    Throat swells   Amlodipine     PATIENT NOT SURE THINKS LEG SWELLING. USE IN THE 1990'S   Atorvastatin Other (See Comments)   Simvastatin Other (See Comments)   Statins     Myalgias    Carvedilol Rash   Prior to Admission medications   Medication Sig Start Date End Date Taking? Authorizing Provider  Apoaequorin (PREVAGEN PO) Take by mouth daily.   Yes [provider]  cholecalciferol (VITAMIN D) 1000 UNITS tablet Take 1,000 Units by mouth  daily.   Yes [provider]  Coenzyme Q10-Vitamin E (QUNOL ULTRA COQ10 PO) Take 1 capsule by mouth daily.   Yes [provider]  felodipine (PLENDIL) 2.5 MG 24 hr tablet TAKE 1 TABLET BY MOUTH EVERY DAY 01/07/21  Yes Marykay Lex, MD  fexofenadine (ALLEGRA) 180 MG tablet Take 180 mg by mouth daily as needed for allergies or rhinitis.   Yes [provider]  irbesartan-hydrochlorothiazide (AVALIDE) 300-12.5 MG per tablet Take 1 tablet by mouth daily.  10/29/13  Yes [provider]  Magnesium 250 MG TABS Take 1 tablet by mouth daily.   Yes [provider]  metoprolol succinate (TOPROL-XL) 50 MG 24 hr tablet Take 1 tablet (50 mg total) by mouth daily. Take with or immediately following a meal. 10/06/22 01/04/23 Yes Cannon Kettle, PA-C  Multiple Vitamin (MULTIVITAMIN) capsule Take 1 capsule by mouth daily.   Yes [provider]  Omega-3 Fatty Acids (FISH OIL) 1200 MG CAPS Take 3 capsules (3,600 mg total) by mouth daily. 12/22/16  Yes Marykay Lex, MD  potassium chloride SA (KLOR-CON M20) 20 MEQ tablet Take 1 tablet (20 mEq total) by mouth daily. 04/01/22  Yes Marykay Lex, MD  rosuvastatin (CRESTOR) 40 MG tablet Take 1 tablet (40 mg total) by mouth daily. 10/06/22 01/04/23 Yes Cannon Kettle, PA-C  folic acid (FOLVITE) 400 MCG tablet Take 800 mcg by mouth daily.     [provider]  nitroGLYCERIN (NITROSTAT) 0.4 MG SL tablet Place 1 tablet (0.4 mg total) under the tongue every 5 (five) minutes as needed for chest pain. 10/06/22 01/04/23  Cannon Kettle, PA-C   No results found. - Positive ROS: All other systems have been reviewed and were otherwise negative with the exception of those mentioned in the HPI and as above.  Physical Exam: General: No acute distress, resting comfortably Cardiovascular: BUE warm and well perfused, normal rate Respiratory: Normal WOB on RA Skin: Warm and dry Neurologic: Sensation intact  distally Psychiatric: Patient is at baseline mood and affect  Left upper Extremity  Splint is clean and dry.  The thumb is warm and well-perfused is cap the refill.  Sensations intact light touch at the radial and ulnar aspects of the thumb tip.  She has full painless active range of motion of all fingers.   Assessment: 77 year old female 7-week status post closed duction percutaneous pinning of severely comminuted left thumb proximal phalangeal base fracture.  She presents today for removal of K wires.  Plan: OR today for hardware removal under local  with MAC. We again reviewed the risks of surgery which include bleeding, infection, damage to neurovascular structures, persistent symptoms, need for additional surgery.  Informed consent was signed.  All questions were answered.   Marlyne Beards, M.D. [facility] 9:37 AM

## 2022-10-13 NOTE — Op Note (Signed)
   Date of Surgery: 10/13/2022  INDICATIONS: Patient is a 77 y.o.-year-old female with a closed, severely comminuted, intra-aricular fracture of the left thumb proximal phalanx that was treated with closed reduction and percutaneous pinning 7 weeks ago.  She presents today for removal of the buried k-wires.  Risks, benefits, and alternatives to surgery were again discussed with the patient in the preoperative area. The patient wishes to proceed with surgery.  Informed consent was signed after our discussion.   PREOPERATIVE DIAGNOSIS:  Closed, severely comminuted, intra-aricular fracture of the left thumb proximal phalanx  Retained orthopedic hardware  POSTOPERATIVE DIAGNOSIS: Same.  PROCEDURE:  Removal of deep implant x 2   SURGEON: Waylan Rocher, M.D.  ASSIST: None  ANESTHESIA:  Local, MAC  IV FLUIDS AND URINE: See anesthesia.  ESTIMATED BLOOD LOSS: <5 mL.  IMPLANTS: * No implants in log *   DRAINS: None  COMPLICATIONS: None  DESCRIPTION OF PROCEDURE: The patient was met in the preoperative holding area where the surgical site was marked and the informed consent form was signed.  The patient was then brought back to the operating room and remained on the stretcher.  A hand table was placed adjacent to the operative extremity and locked into place.  A formal timeout was performed to confirm that this was the correct patient, surgical side, surgical site, and surgical procedure.  All were present and in agreement. Following formal timeout, a local block in the area of the pins was performed using 5 mL of 1% plain lidocaine.  The left upper extremity was then prepped and draped in the usual and sterile fashion.   Following a second formal timeout, I began with a small incision directly over the radial pin.  The skin was incised.  Blunt dissection was used to identify the K wire.  The K wire was removed without issue using a needle driver.  More ulnar wire was difficult to palpate.   The mini fluoroscopy machine was brought into localize the K wire.  A small incision was made directly over the tip of the wire.  Blunt dissection was used to identify the wire.  The second K wire was also removed uneventfully.  At this point, AP and lateral views of the thumb were obtained.  The articular surface was congruent on all views.  The wires were removed in their entirety.  At this point the wounds were thoroughly irrigated and a single 4-0 nylon suture was placed in each wound.  The thumb was then dressed with Xeroform, bulky Kerlix, and an Ace wrap.  The patient was reversed from sedation.  All counts were correct x 2 at the end of the procedure.  The patient was then taken to the PACU in stable condition.   POSTOPERATIVE PLAN: She will be discharged to home with appropriate postop instructions.  A referral to hand therapy has been placed to begin working on active range of motion exercises.  I will see her in about 10 to 14 days for her first postop visit.  Waylan Rocher, MD 11:41 AM

## 2022-10-13 NOTE — Discharge Instructions (Addendum)
Waylan Rocher, M.D. Hand Surgery  POST-OPERATIVE DISCHARGE INSTRUCTIONS   PRESCRIPTIONS: - You may have been given a prescription to be taken as directed for post-operative pain control.  You may also take over the counter ibuprofen/aleve and tylenol for pain. Take this as directed on the packaging. Do not exceed 3000 mg tylenol/acetaminophen in 24 hours.  Ibuprofen 600-800 mg (3-4) tablets by mouth every 6 hours as needed for pain.   OR  Aleve 2 tablets by mouth every 12 hours (twice daily) as needed for pain.   AND/OR  Tylenol 1000 mg (2 tablets) every 8 hours as needed for pain.  - Please use your pain medication carefully, as refills are limited and you may not be provided with one.  As stated above, please use over the counter pain medicine - it will also be helpful with decreasing your swelling.    ANESTHESIA: -After your surgery, post-surgical discomfort or pain is likely. This discomfort can last several days to a few weeks. At certain times of the day your discomfort may be more intense.   Did you receive a nerve block?   - A nerve block can provide pain relief for one hour to two days after your surgery. As long as the nerve block is working, you will experience little or no sensation in the area the surgeon operated on.  - As the nerve block wears off, you will begin to experience pain or discomfort. It is very important that you begin taking your prescribed pain medication before the nerve block fully wears off. Treating your pain at the first sign of the block wearing off will ensure your pain is better controlled and more tolerable when full-sensation returns. Do not wait until the pain is intolerable, as the medicine will be less effective. It is better to treat pain in advance than to try and catch up.   General Anesthesia:  If you did not receive a nerve block during your surgery, you will need to start taking your pain medication shortly after your surgery and  should continue to do so as prescribed by your surgeon.     ICE AND ELEVATION: - You may use ice for the first 48-72 hours, but it is not critical.   - Motion of your fingers is very important to decrease the swelling.  - Elevation, as much as possible for the next 48 hours, is critical for decreasing swelling as well as for pain relief. Elevation means when you are seated or lying down, you hand should be at or above your heart. When walking, the hand needs to be at or above the level of your elbow.  - If the bandage gets too tight, it may need to be loosened. Please contact our office and we will instruct you in how to do this.    SURGICAL BANDAGES:  - Keep your dressing and/or splint clean and dry at all times.  Your dressing will be removed at your first therapy appointment - You may place a plastic bag over your bandage to shower, but be careful, do not get your bandages wet.     HAND THERAPY:  - You will be contacted to schedule your first therapy appointment.    ACTIVITY AND WORK: - You are encouraged to move any fingers which are not in the bandage.  - Light use of the fingers is allowed to assist the other hand with daily hygiene and eating, but strong gripping or lifting is often uncomfortable and should  be avoided.  - You might miss a variable period of time from work and hopefully this issue has been discussed prior to surgery. You may not do any heavy work with your affected hand for about 2 weeks.    [facility] Second Floor, [address] [place], [place] [address] [phone]    Post Anesthesia Home Care Instructions  Activity: Get plenty of rest for the remainder of the day. A responsible individual must stay with you for 24 hours following the procedure.  For the next 24 hours, DO NOT: -Drive a car -Advertising copywriter -Drink alcoholic beverages -Take any medication unless instructed by your physician -Make any legal decisions or sign important  papers.  Meals: Start with liquid foods such as gelatin or soup. Progress to regular foods as tolerated. Avoid greasy, spicy, heavy foods. If nausea and/or vomiting occur, drink only clear liquids until the nausea and/or vomiting subsides. Call your physician if vomiting continues.  Special Instructions/Symptoms: Your throat may feel dry or sore from the anesthesia or the breathing tube placed in your throat during surgery. If this causes discomfort, gargle with warm salt water. The discomfort should disappear within 24 hours.  If you had a scopolamine patch placed behind your ear for the management of post- operative nausea and/or vomiting:  1. The medication in the patch is effective for 72 hours, after which it should be removed.  Wrap patch in a tissue and discard in the trash. Wash hands thoroughly with soap and water. 2. You may remove the patch earlier than 72 hours if you experience unpleasant side effects which may include dry mouth, dizziness or visual disturbances. 3. Avoid touching the patch. Wash your hands with soap and water after contact with the patch.     May have Tylenol today 3:03 PM

## 2022-10-13 NOTE — Interval H&P Note (Signed)
History and Physical Interval Note:  10/13/2022 9:39 AM  [name]  has presented today for surgery, with the diagnosis of Left Thumb proximal phalanx fracture.  The various methods of treatment have been discussed with the patient and family. After consideration of risks, benefits and other options for treatment, the patient has consented to  Procedure(s): REMOVAL OF DEEP IMPLANT FROM THUMB (Left) as a surgical intervention.  The patient's history has been reviewed, patient examined, no change in status, stable for surgery.  I have reviewed the patient's chart and labs.  Questions were answered to the patient's satisfaction.     Delana Manganello Devon Pretty

## 2022-10-13 NOTE — Telephone Encounter (Addendum)
Called patient regarding results. Left message for patient to call office.----- Message from Cannon Kettle sent at 10/12/2022  2:00 PM EDT ----- Goal LDL less than 70.  LDL 76.  At recent, appointment, we increased her Crestor from 20 mg and 40 mg alternating to 40 mg daily.  I believe this change will get you to goal. Otherwise, HDL (good cholesterol) improved and triglycerides near goal.  To help cholesterol, I recommended Mediterranean diet, DASH diet, vegetarian diet, low-carbohydrate diet and avoidance of trans fats.  Nuts, high-fiber foods, and fiber supplements may also improve lipids.

## 2022-10-14 ENCOUNTER — Encounter (HOSPITAL_BASED_OUTPATIENT_CLINIC_OR_DEPARTMENT_OTHER): Payer: Self-pay | Admitting: Orthopedic Surgery

## 2022-10-14 NOTE — Anesthesia Postprocedure Evaluation (Signed)
Anesthesia Post Note  Patient: [name]  Procedure(s) Performed: REMOVAL OF DEEP IMPLANT FROM THUMB (Left: Thumb)     Patient location during evaluation: PACU Anesthesia Type: MAC Level of consciousness: awake and alert Pain management: pain level controlled Vital Signs Assessment: post-procedure vital signs reviewed and stable Respiratory status: spontaneous breathing, nonlabored ventilation, respiratory function stable and patient connected to nasal cannula oxygen Cardiovascular status: stable and blood pressure returned to baseline Postop Assessment: no apparent nausea or vomiting Anesthetic complications: no  No notable events documented.  Last Vitals:  Vitals:   10/13/22 1302 10/13/22 1315  BP: (!) 158/64 (!) 158/59  Pulse: 60 (!) 57  Resp:    Temp:    SpO2: 98% 98%    Last Pain:  Vitals:   10/14/22 0947  TempSrc:   PainSc: 0-No pain                 Maury Bamba L Joh Rao

## 2022-10-18 ENCOUNTER — Telehealth: Payer: Self-pay

## 2022-10-18 ENCOUNTER — Other Ambulatory Visit: Payer: Self-pay

## 2022-10-18 MED ORDER — ROSUVASTATIN CALCIUM 40 MG PO TABS: 40.0000 mg | ORAL_TABLET | Freq: Every day | ORAL | 3 refills | Status: DC

## 2022-10-18 NOTE — Telephone Encounter (Addendum)
Called patient regarding results. Patient regarding results. Patient advise to start Crestor 40 mg.----- Message from Cannon Kettle sent at 10/12/2022  2:00 PM EDT ----- Goal LDL less than 70.  LDL 76.  At recent, appointment, we increased her Crestor from 20 mg and 40 mg alternating to 40 mg daily.  I believe this change will get you to goal. Otherwise, HDL (good cholesterol) improved and triglycerides near goal.  To help cholesterol, I recommended Mediterranean diet, DASH diet, vegetarian diet, low-carbohydrate diet and avoidance of trans fats.  Nuts, high-fiber foods, and fiber supplements may also improve lipids.

## 2023-01-05 ENCOUNTER — Other Ambulatory Visit: Payer: Self-pay

## 2023-01-05 DIAGNOSIS — E785 Hyperlipidemia, unspecified: Secondary | ICD-10-CM

## 2023-01-06 LAB — LIPID PANEL
Chol/HDL Ratio: 2.6 ratio (ref 0.0–4.4)
Cholesterol, Total: 165 mg/dL (ref 100–199)
HDL: 63 mg/dL (ref >39)
LDL Chol Calc (NIH): 74 mg/dL (ref 0–99)
Triglycerides: 168 mg/dL — ABNORMAL HIGH (ref 0–149)
VLDL Cholesterol Cal: 28 mg/dL (ref 5–40)

## 2023-01-12 ENCOUNTER — Other Ambulatory Visit: Payer: Self-pay | Admitting: *Deleted

## 2023-01-12 ENCOUNTER — Telehealth: Payer: Self-pay | Admitting: *Deleted

## 2023-01-12 DIAGNOSIS — I25810 Atherosclerosis of coronary artery bypass graft(s) without angina pectoris: Secondary | ICD-10-CM

## 2023-01-12 DIAGNOSIS — Z951 Presence of aortocoronary bypass graft: Secondary | ICD-10-CM

## 2023-01-12 DIAGNOSIS — E785 Hyperlipidemia, unspecified: Secondary | ICD-10-CM

## 2023-01-12 MED ORDER — NEXLIZET 180-10 MG PO TABS: 1.0000 | ORAL_TABLET | Freq: Every day | ORAL | 6 refills | Status: AC

## 2023-01-12 NOTE — Telephone Encounter (Signed)
Patient reviewed  e-sent new prescription   Mailed  labslip for lipid and liver  Left detail message on voicemail -  Possible prior- Berkley Harvey is needed

## 2023-01-12 NOTE — Telephone Encounter (Signed)
-----   Message from Bryan Lemma sent at 01/09/2023  2:54 PM EST ----- No real change in cholesterol levels.  Total cholesterol stable 164 and now 165.  LDL now 74, was 76.  HDL also stable similarly.  I think to get to the next level of lipid control we will need to add Nexlizet 10-180 mg.  (None of the side effects of statins, but should give [place] a least 10 point reduction in LDL)  New Rx Nexlizet 10-180 mg PO daily --> Disp #30 tab, refills 11 (if tolerating, and noted improvement in lipids after 3 months, we can change a 71-month supply.) Recheck lipid panel and LFTs in 3 months.  Bryan Lemma, MD

## 2023-01-19 ENCOUNTER — Telehealth: Payer: Self-pay | Admitting: Cardiology

## 2023-01-19 NOTE — Telephone Encounter (Signed)
Pt's husband calling back from getting mailed a letter about results. Please advise

## 2023-01-19 NOTE — Telephone Encounter (Signed)
Called and spoke to patient who had questions about lab orders she received in the mail. Advised her they were for repeat labs that were ordered for 3 months after starting Nexilet. Patient was made aware at that time prescription for medication was sent to her [facility]. Patient verbalized understanding and agree. No further questions at this time.

## 2023-01-21 ENCOUNTER — Telehealth: Payer: Self-pay

## 2023-01-21 ENCOUNTER — Other Ambulatory Visit (HOSPITAL_COMMUNITY): Payer: Self-pay

## 2023-01-21 NOTE — Telephone Encounter (Signed)
Pharmacy Patient Advocate Encounter   Received notification from CoverMyMeds that prior authorization for NEXLIZET is required/requested.   Insurance verification completed.   The patient is insured through [facility] .   Per test claim: PA required; PA submitted to above mentioned insurance via CoverMyMeds Key/confirmation #/EOC [id number] Status is pending

## 2023-01-24 ENCOUNTER — Other Ambulatory Visit (HOSPITAL_COMMUNITY): Payer: Self-pay

## 2023-01-24 NOTE — Telephone Encounter (Signed)
Pharmacy Patient Advocate Encounter  Received notification from [facility] that Prior Authorization for NEXLIZET has been APPROVED from 01/21/23 to 07/21/23. Ran test claim, Copay is $87. This test claim was processed through [facility]- copay amounts may vary at other pharmacies due to pharmacy/plan contracts, or as the patient moves through the different stages of their insurance plan.

## 2023-04-16 ENCOUNTER — Other Ambulatory Visit: Payer: Self-pay | Admitting: Cardiology

## 2023-04-16 DIAGNOSIS — E785 Hyperlipidemia, unspecified: Secondary | ICD-10-CM

## 2023-05-16 ENCOUNTER — Other Ambulatory Visit: Payer: Self-pay | Admitting: Internal Medicine

## 2023-05-16 DIAGNOSIS — M8580 Other specified disorders of bone density and structure, unspecified site: Secondary | ICD-10-CM

## 2023-06-14 ENCOUNTER — Other Ambulatory Visit: Payer: Self-pay | Admitting: Cardiology

## 2023-07-04 ENCOUNTER — Telehealth: Payer: Self-pay | Admitting: Pharmacy Technician

## 2023-07-04 NOTE — Telephone Encounter (Signed)
 Pharmacy Patient Advocate Encounter  Received notification from [facility] that Prior Authorization for nexlizet  has been APPROVED from 03/09/23 to 03/07/24   PA #/Case ID/Reference #: [id number]

## 2023-07-07 ENCOUNTER — Encounter: Payer: Self-pay | Admitting: Physician Assistant

## 2023-08-11 NOTE — Progress Notes (Signed)
 Assessment/Plan:     Pamela FERRUFINO is a very pleasant 78 y.o. year old RH female with a history of hypertension, hyperlipidemia, OSA not on CPAP, CAD status post CABG, GERD-hiatal hernia, arthritis seen today for evaluation of memory loss. MoCA today is .  Workup is in progress.    Memory Impairment of unclear etiology, concern for ***  MRI brain without contrast to assess for underlying structural abnormality and assess vascular load  Neurocognitive testing to further evaluate cognitive concerns and determine other underlying cause of memory changes, including potential contribution from sleep, anxiety, attention, or depression among others  Recommend good control of cardiovascular risk factors.   Continue to control mood as per PCP Recommend using CPAP for OSA [place] up in ***months   Subjective:    The patient is accompanied by ***  who supplements  the history.    How long did patient have memory difficulties?  For about.  Patient reports some difficulty remembering new information, recent conversations, names. She even forgot that she had surgery on her thumb in 2024.  She has taken Prevagen in the past without any improvements. repeats oneself?  Endorsed Disoriented when walking into a room? Denies ***  Leaving objects in unusual places?  Denies.   Wandering behavior? Denies.   Any personality changes, or depression, anxiety?  Her mother recently died, bringing situational depression.*** Hallucinations or paranoia? Denies.   Seizures? Denies.    Any sleep changes?  Sleeps well *** Does not sleep well. **  frequent nightmares or dream reenactment, other REM behavior or sleepwalking   Sleep apnea? Denies.   Any hygiene concerns?  Denies.   Independent of bathing and dressing? Endorsed  Does the patient need help with medications?  is in charge *** Who is in charge of the finances?  is in charge   *** Any changes in appetite?   Denies. ***   Patient have trouble  swallowing?  Denies.   Does the patient cook? No*** yes, denies forgetting common recipes or kitchen accidents *** Any headaches?  Denies.   Chronic pain? Denies.   Ambulates with difficulty? Denies. ***  Needs a cane*** Needs a walker *** to ambulate for stability.   Recent falls or head injuries? Denies.     Vision changes?  Denies any new issues.  Has a history of*** Any strokelike symptoms? Denies.   Any tremors? Denies. *** Any anosmia? Denies.   Any incontinence of urine? Denies.   Any bowel dysfunction? Denies.      Patient lives with ***  History of heavy alcohol intake? Denies.   History of heavy tobacco use? Denies.   Family history of dementia?   *** with dementia  Does patient drive? No longer drives  *** yes, denies getting lost.***   Recent labs TSH 1.95, B12 580  Allergies  Allergen Reactions   Etodolac Anaphylaxis    migraines   Motrin [Ibuprofen] Anaphylaxis and Swelling    Throat swells   Amlodipine      PATIENT NOT SURE THINKS LEG SWELLING. USE IN THE 1990'S   Atorvastatin Other (See Comments)   Simvastatin Other (See Comments)   Statins     Myalgias    Carvedilol  Rash    Current Outpatient Medications  Medication Instructions   Apoaequorin (PREVAGEN PO) Oral, Daily   Bempedoic Acid-Ezetimibe (NEXLIZET ) 180-10 MG TABS 1 tablet, Oral, Daily   cholecalciferol (VITAMIN D) 1,000 Units, Oral, Daily   Coenzyme Q10-Vitamin E (QUNOL ULTRA COQ10 PO) 1  capsule, Oral, Daily   felodipine  (PLENDIL ) 2.5 MG 24 hr tablet TAKE 1 TABLET BY MOUTH EVERY DAY   fexofenadine (ALLEGRA) 180 mg, Daily PRN   Fish Oil  3,600 mg, Oral, Daily   folic acid (FOLVITE) 800 mcg, Oral, Daily   irbesartan-hydrochlorothiazide  (AVALIDE) 300-12.5 MG per tablet 1 tablet, Daily   Magnesium 250 MG TABS 1 tablet, Daily   metoprolol  succinate (TOPROL -XL) 50 mg, Oral, Daily, Take with or immediately following a meal.   Multiple Vitamin (MULTIVITAMIN) capsule 1 capsule, Daily   nitroGLYCERIN   (NITROSTAT ) 0.4 mg, Sublingual, Every 5 min PRN   potassium chloride  SA (KLOR-CON  M20) 20 MEQ tablet 20 mEq, Oral, Daily   rosuvastatin  (CRESTOR ) 40 mg, Oral, Daily     VITALS:  There were no vitals filed for this visit.    PHYSICAL EXAM   HEENT:  Normocephalic, atraumatic.  The superficial temporal arteries are without ropiness or tenderness. Cardiovascular: Regular rate and rhythm. Lungs: Clear to auscultation bilaterally. Neck: There are no carotid bruits noted bilaterally.  NEUROLOGICAL:     No data to display              No data to display           Orientation:  Alert and oriented to person, place and not to time***. No aphasia or dysarthria. Fund of knowledge is appropriate. Recent and remote memory impaired.  Attention and concentration are reduced***.  Able to name objects and repeat phrases. *** Delayed recall  /5 .*** Cranial nerves: There is good facial symmetry. Extraocular muscles are intact and visual fields are full to confrontational testing. Speech is fluent and clear. No tongue deviation. Hearing is intact to conversational tone.*** Tone: Tone is good throughout. Sensation: Sensation is intact to light touch.  Vibration is intact at the bilateral big toe.  Coordination: The patient has no difficulty with RAM's or FNF bilaterally. Normal finger to nose  Motor: Strength is 5/5 in the bilateral upper and lower extremities. There is no pronator drift. There are no fasciculations noted. DTR's: Deep tendon reflexes are 2/4 bilaterally. Gait and Station: The patient is able to ambulate without difficulty. Gait is cautious and narrow. Stride length is normal. ***      Thank you for allowing us  the opportunity to participate in the care of this nice patient. Please do not hesitate to contact us  for any questions or concerns.   Total time spent on today's visit was *** minutes dedicated to this patient today, preparing to see patient, examining the patient,  ordering tests and/or medications and counseling the patient, documenting clinical information in the EHR or other health record, independently interpreting results and communicating results to the patient/family, discussing treatment and goals, answering patient's questions and coordinating care.  Cc:  Brenita Callow, L.Rozelle Corning, MD (Inactive)  Tex Filbert 08/11/2023 5:37 PM

## 2023-08-12 ENCOUNTER — Encounter: Payer: Self-pay | Admitting: Physician Assistant

## 2023-08-12 ENCOUNTER — Ambulatory Visit (INDEPENDENT_AMBULATORY_CARE_PROVIDER_SITE_OTHER): Admitting: Physician Assistant

## 2023-08-12 ENCOUNTER — Ambulatory Visit

## 2023-08-12 VITALS — BP 150/80 | HR 77 | Resp 20 | Ht 63.0 in | Wt 127.0 lb

## 2023-08-12 DIAGNOSIS — R413 Other amnesia: Secondary | ICD-10-CM | POA: Insufficient documentation

## 2023-08-12 NOTE — Patient Instructions (Addendum)
 It was a pleasure to see you today at our office.   Recommendations:   MRI of the brain, the radiology office will call you to arrange you appointment  Follow up in 3 months Monitor driving   Recommend visiting the website : " Dementia Success Path" to better understand some behaviors related to memory loss.  For psychiatric meds, mood meds: Please have your primary care physician manage these medications.  If you have any severe symptoms of a stroke, or other severe issues such as confusion,severe chills or fever, etc call 911 or go to the ER as you may need to be evaluated further Counseling regarding caregiver distress, including caregiver depression, anxiety and issues regarding community resources, adult day care programs, adult living facilities, or memory care questions:  please contact your  Primary Doctor's Social Worker   FOR Memory  decline, memory medications: Call our office [phone]    [email or web address]   RECOMMENDATIONS FOR ALL PATIENTS WITH MEMORY PROBLEMS: 1. Continue to exercise (Recommend 30 minutes of walking everyday, or 3 hours every week) 2. Increase social interactions - continue going to [place] and enjoy social gatherings with friends and family 3. Eat healthy, avoid fried foods and eat more fruits and vegetables 4. Maintain adequate blood pressure, blood sugar, and blood cholesterol level. Reducing the risk of stroke and cardiovascular disease also helps promoting better memory. 5. Avoid stressful situations. Live a simple life and avoid aggravations. Organize your time and prepare for the next day in anticipation. 6. Sleep well, avoid any interruptions of sleep and avoid any distractions in the bedroom that may interfere with adequate sleep quality 7. Avoid sugar, avoid sweets as there is a strong link between excessive sugar intake, diabetes, and cognitive impairment We discussed the Mediterranean diet,  which has been shown to help patients reduce the risk of progressive memory disorders and reduces cardiovascular risk. This includes eating fish, eat fruits and green leafy vegetables, nuts like almonds and hazelnuts, walnuts, and also use olive oil. Avoid fast foods and fried foods as much as possible. Avoid sweets and sugar as sugar use has been linked to worsening of memory function.  There is always a concern of gradual progression of memory problems. If this is the case, then we may need to adjust level of care according to patient needs. Support, both to the patient and caregiver, should then be put into place.        DRIVING: Regarding driving, in patients with progressive memory problems, driving will be impaired. We advise to have someone else do the driving if trouble finding directions or if minor accidents are reported. Independent driving assessment is available to determine safety of driving.   If you are interested in the driving assessment, you can contact the following:  [employer] in [place] [phone]  [facility] [phone]  [facility] [phone]  [facility] [phone] or [phone]   FALL PRECAUTIONS: Be cautious when walking. Scan the area for obstacles that may increase the risk of trips and falls. When getting up in the mornings, sit up at the edge of the bed for a few minutes before getting out of bed. Consider elevating the bed at the head end to avoid drop of blood pressure when getting up. Walk always in a well-lit room (use night lights in the walls). Avoid area rugs or power cords from appliances in the middle of the walkways. Use a walker or a cane if necessary and consider physical therapy for  balance exercise. Get your eyesight checked regularly.  FINANCIAL OVERSIGHT: Supervision, especially oversight when making financial decisions or transactions is also recommended.  HOME SAFETY: Consider the  safety of the kitchen when operating appliances like stoves, microwave oven, and blender. Consider having supervision and share cooking responsibilities until no longer able to participate in those. Accidents with firearms and other hazards in the house should be identified and addressed as well.   ABILITY TO BE LEFT ALONE: If patient is unable to contact 911 operator, consider using LifeLine, or when the need is there, arrange for someone to stay with patients. Smoking is a fire hazard, consider supervision or cessation. Risk of wandering should be assessed by caregiver and if detected at any point, supervision and safe proof recommendations should be instituted.  MEDICATION SUPERVISION: Inability to self-administer medication needs to be constantly addressed. Implement a mechanism to ensure safe administration of the medications.      Mediterranean Diet A Mediterranean diet refers to food and lifestyle choices that are based on the traditions of countries located on the [employer]. This way of eating has been shown to help prevent certain conditions and improve outcomes for people who have chronic diseases, like kidney disease and heart disease. What are tips for following this plan? Lifestyle  Cook and eat meals together with your family, when possible. Drink enough fluid to keep your urine clear or pale yellow. Be physically active every day. This includes: Aerobic exercise like running or swimming. Leisure activities like gardening, walking, or housework. Get 7-8 hours of sleep each night. If recommended by your health care provider, drink red wine in moderation. This means 1 glass a day for nonpregnant women and 2 glasses a day for men. A glass of wine equals 5 oz (150 mL). Reading food labels  Check the serving size of packaged foods. For foods such as [place] and pasta, the serving size refers to the amount of cooked product, not dry. Check the total fat in packaged foods. Avoid  foods that have saturated fat or trans fats. Check the ingredients list for added sugars, such as corn syrup. Shopping  At the grocery store, buy most of your food from the areas near the walls of the store. This includes: Fresh fruits and vegetables (produce). Grains, beans, nuts, and seeds. Some of these may be available in unpackaged forms or large amounts (in bulk). Fresh seafood. Poultry and eggs. Low-fat dairy products. Buy whole ingredients instead of prepackaged foods. Buy fresh fruits and vegetables in-season from local farmers markets. Buy frozen fruits and vegetables in resealable bags. If you do not have access to quality fresh seafood, buy precooked frozen shrimp or canned fish, such as tuna, salmon, or sardines. Buy small amounts of raw or cooked vegetables, salads, or olives from the deli or salad bar at your store. Stock your pantry so you always have certain foods on hand, such as olive oil, canned tuna, canned tomatoes, [place], pasta, and beans. Cooking  Cook foods with extra-virgin olive oil instead of using butter or other vegetable oils. Have meat as a side dish, and have vegetables or grains as your main dish. This means having meat in small portions or adding small amounts of meat to foods like pasta or stew. Use beans or vegetables instead of meat in common dishes like chili or lasagna. Experiment with different cooking methods. Try roasting or broiling vegetables instead of steaming or sauteing them. Add frozen vegetables to soups, stews, pasta, or [place]. Add nuts  or seeds for added healthy fat at each meal. You can add these to yogurt, salads, or vegetable dishes. Marinate fish or vegetables using olive oil, lemon juice, garlic, and fresh herbs. Meal planning  Plan to eat 1 vegetarian meal one day each week. Try to work up to 2 vegetarian meals, if possible. Eat seafood 2 or more times a week. Have healthy snacks readily available, such as: Vegetable sticks with  hummus. Greek yogurt. Fruit and nut trail mix. Eat balanced meals throughout the week. This includes: Fruit: 2-3 servings a day Vegetables: 4-5 servings a day Low-fat dairy: 2 servings a day Fish, poultry, or lean meat: 1 serving a day Beans and legumes: 2 or more servings a week Nuts and seeds: 1-2 servings a day Whole grains: 6-8 servings a day Extra-virgin olive oil: 3-4 servings a day Limit red meat and sweets to only a few servings a month What are my food choices? Mediterranean diet Recommended Grains: Whole-grain pasta. Brown [place]. Bulgar wheat. Polenta. Couscous. Whole-wheat bread. Dwyane Glad. Vegetables: Artichokes. Beets. Broccoli. Cabbage. Carrots. Eggplant. Green beans. Chard. Kale. Spinach. Onions. Leeks. Peas. Squash. Tomatoes. Peppers. Radishes. Fruits: Apples. Apricots. Avocado. Berries. Bananas. Cherries. Dates. Figs. Grapes. Lemons. Melon. Oranges. Peaches. Plums. Pomegranate. Meats and other protein foods: Beans. Almonds. Sunflower seeds. Pine nuts. Peanuts. Cod. Salmon. Scallops. Shrimp. Tuna. Tilapia. Clams. Oysters. Eggs. Dairy: Low-fat milk. Cheese. Greek yogurt. Beverages: Water. Red wine. Herbal tea. Fats and oils: Extra virgin olive oil. Avocado oil. Grape seed oil. Sweets and desserts: [place] yogurt with honey. Baked apples. Poached pears. Trail mix. Seasoning and other foods: Basil. Cilantro. Coriander. Cumin. Mint. Parsley. Sage. Rosemary. Tarragon. Garlic. Oregano. Thyme. Pepper. Balsalmic vinegar. Tahini. Hummus. Tomato sauce. Olives. Mushrooms. Limit these Grains: Prepackaged pasta or [place]ieri dishes. Prepackaged cereal with added sugar. Vegetables: Deep fried potatoes (french fries). Fruits: Fruit canned in syrup. Meats and other protein foods: Beef. Pork. Lamb. Poultry with skin. Hot dogs. Helene Loader. Dairy: Ice cream. Sour cream. Whole milk. Beverages: Juice. Sugar-sweetened soft drinks. Beer. Liquor and spirits. Fats and oils: Butter. Canola oil.  Vegetable oil. Beef fat (tallow). Lard. Sweets and desserts: Cookies. Cakes. Pies. Candy. Seasoning and other foods: Mayonnaise. Premade sauces and marinades. The items listed may not be a complete list. Talk with your dietitian about what dietary choices are right for you. Summary The Mediterranean diet includes both food and lifestyle choices. Eat a variety of fresh fruits and vegetables, beans, nuts, seeds, and whole grains. Limit the amount of red meat and sweets that you eat. Talk with your health care provider about whether it is safe for you to drink red wine in moderation. This means 1 glass a day for nonpregnant women and 2 glasses a day for men. A glass of wine equals 5 oz (150 mL). This information is not intended to replace advice given to you by your health care provider. Make sure you discuss any questions you have with your health care provider. Document Released: 10/16/2015 Document Revised: 11/18/2015 Document Reviewed: 10/16/2015 Elsevier Interactive Patient Education  2017 [employer].

## 2023-08-20 ENCOUNTER — Other Ambulatory Visit: Payer: Self-pay | Admitting: Cardiology

## 2023-08-23 ENCOUNTER — Other Ambulatory Visit

## 2023-08-23 ENCOUNTER — Other Ambulatory Visit: Payer: Self-pay

## 2023-08-23 ENCOUNTER — Telehealth: Payer: Self-pay

## 2023-08-23 MED ORDER — ROSUVASTATIN CALCIUM 40 MG PO TABS: 40.0000 mg | ORAL_TABLET | Freq: Every day | ORAL | 0 refills | Status: DC

## 2023-08-23 NOTE — Telephone Encounter (Signed)
 The patient was asked to verify how she has been taking her Rousvastatin and the patient said one whole tablet daily not how the refill request was sent to the office for, 1 tablet by mouth every other day alternating with 1/2 tablet every other day.

## 2023-09-02 ENCOUNTER — Other Ambulatory Visit

## 2023-09-02 ENCOUNTER — Ambulatory Visit
Admission: RE | Admit: 2023-09-02 | Discharge: 2023-09-02 | Disposition: A | Discharge status: Home / Self Care | Source: Ambulatory Visit | Attending: Physician Assistant | Admitting: Physician Assistant

## 2023-09-10 ENCOUNTER — Ambulatory Visit: Payer: Self-pay | Admitting: Physician Assistant

## 2023-09-14 ENCOUNTER — Telehealth: Payer: Self-pay

## 2023-09-14 ENCOUNTER — Other Ambulatory Visit: Payer: Self-pay | Admitting: Physician Assistant

## 2023-09-14 MED ORDER — DONEPEZIL HCL 10 MG PO TABS: ORAL_TABLET | ORAL | 3 refills | Status: AC

## 2023-09-14 NOTE — Telephone Encounter (Signed)
 Please send in RX and let me know when it is sent in. Pharmacy is correct thanks.

## 2023-09-15 NOTE — Telephone Encounter (Signed)
 Close encounter

## 2023-09-22 ENCOUNTER — Other Ambulatory Visit: Payer: Self-pay | Admitting: Cardiology

## 2023-11-12 ENCOUNTER — Other Ambulatory Visit: Payer: Self-pay | Admitting: Cardiology

## 2023-11-21 ENCOUNTER — Other Ambulatory Visit: Payer: Self-pay | Admitting: Cardiology

## 2023-11-22 MED ORDER — METOPROLOL SUCCINATE ER 50 MG PO TB24: 50.0000 mg | ORAL_TABLET | Freq: Every day | ORAL | 0 refills | Status: AC

## 2023-12-02 ENCOUNTER — Encounter: Payer: Self-pay | Admitting: Physician Assistant

## 2023-12-02 ENCOUNTER — Ambulatory Visit: Admitting: Physician Assistant

## 2024-01-03 ENCOUNTER — Ambulatory Visit

## 2024-01-03 VITALS — BP 138/84 | HR 66 | Temp 97.5°F | Ht 63.0 in | Wt 137.0 lb

## 2024-01-03 DIAGNOSIS — E785 Hyperlipidemia, unspecified: Secondary | ICD-10-CM

## 2024-01-03 DIAGNOSIS — Z7689 Persons encountering health services in other specified circumstances: Secondary | ICD-10-CM

## 2024-01-03 DIAGNOSIS — I2510 Atherosclerotic heart disease of native coronary artery without angina pectoris: Secondary | ICD-10-CM

## 2024-01-03 DIAGNOSIS — I10 Essential (primary) hypertension: Secondary | ICD-10-CM

## 2024-01-03 DIAGNOSIS — Z1159 Encounter for screening for other viral diseases: Secondary | ICD-10-CM

## 2024-01-03 DIAGNOSIS — R413 Other amnesia: Secondary | ICD-10-CM | POA: Diagnosis not present

## 2024-01-03 NOTE — Progress Notes (Signed)
 New Patient Office Visit  Subjective    Patient ID: [name], female    DOB: 06-23-1945  Age: 78 y.o. MRN: [id number]  CC:  Chief Complaint  Patient presents with   New Patient (Initial Visit)    History of Present Illness   [name] is a 78 year old female who presents for establishment of care and medication management. She is accompanied by her husband, [name].  Cognitive impairment - Memory loss leading to neurology consultation with last OV in June 2025. MRI brain unremarkable except for age related changes - Trial of donepezil  (Aricept ) resulted in increased irritability and was discontinued due to worsening symptoms/irritability - Patient and her husband report that this is the main reason for them presenting to the office today as they want to discuss alternative treatment options  - Currently taking OTC Brain Health supplement with some improvement, but persistent memory issue  Hypertension and hyperlipidemia - History of high blood pressure and hyperlipidemia - Current medications include rosuvastatin , irbesartan, hydrochlorothiazide , metoprolol , potassium, fish oil , felodipine , and Nexlizet  (bempedoic acid-ezitimibe)  Coronary artery disease status post bypass surgery - History of coronary artery bypass surgery for which she follows with cardiology  Obstructive sleep apnea - Not using CPAP machine, reports no need and sleeps well      Screenings:  Colon Cancer: N/a due to age  Lung Cancer: N/a Breast Cancer: N/a Diabetes: Checking A1c with labs HLD: Checking lipid panel with labs  The 10-year ASCVD risk score (Arnett DK, et al., 2019) is: 32.7%  Outpatient Encounter Medications as of 01/03/2024  Medication Sig   Apoaequorin (PREVAGEN PO) Take by mouth daily.   Bempedoic Acid-Ezetimibe (NEXLIZET ) 180-10 MG TABS Take 1 tablet by mouth daily.   cholecalciferol (VITAMIN D) 1000 UNITS tablet Take 1,000 Units by mouth daily.   Coenzyme Q10-Vitamin E (QUNOL  ULTRA COQ10 PO) Take 1 capsule by mouth daily.   donepezil  (ARICEPT ) 10 MG tablet Take half tablet (5 mg) daily for 2 weeks, then increase to the full tablet at 10 mg daily   felodipine  (PLENDIL ) 2.5 MG 24 hr tablet TAKE 1 TABLET BY MOUTH EVERY DAY   fexofenadine (ALLEGRA) 180 MG tablet Take 180 mg by mouth daily as needed for allergies or rhinitis.   folic acid (FOLVITE) 400 MCG tablet Take 800 mcg by mouth daily.    irbesartan-hydrochlorothiazide  (AVALIDE) 300-12.5 MG per tablet Take 1 tablet by mouth daily.    Magnesium 250 MG TABS Take 1 tablet by mouth daily.   metoprolol  succinate (TOPROL -XL) 50 MG 24 hr tablet Take 1 tablet (50 mg total) by mouth daily. Take with or immediately following a meal.   Multiple Vitamin (MULTIVITAMIN) capsule Take 1 capsule by mouth daily.   Omega-3 Fatty Acids (FISH OIL ) 1200 MG CAPS Take 3 capsules (3,600 mg total) by mouth daily.   potassium chloride  SA (KLOR-CON  M20) 20 MEQ tablet TAKE 1 TABLET BY MOUTH EVERY DAY   rosuvastatin  (CRESTOR ) 40 MG tablet Take 1 tablet (40 mg total) by mouth daily.   [DISCONTINUED] nitroGLYCERIN  (NITROSTAT ) 0.4 MG SL tablet Place 1 tablet (0.4 mg total) under the tongue every 5 (five) minutes as needed for chest pain. (Patient not taking: Reported on 01/03/2024)   No facility-administered encounter medications on file as of 01/03/2024.    Past Medical History:  Diagnosis Date   Bilateral lower extremity edema - mild, likely venous stasis 05/16/2013   CAD (coronary artery disease) of bypass graft 2004/2008   Cath  2008: SVG-D1 Occluded with patent D1, LM 90%, Patent LIMA-LAD & SVG-OM1, patent Native RCA, EF ~45-50% & 2+ MR.; Myoview  2012: No ischemia or infarction, Normal EF; Echo 2013: Nl LV Fxn & normal valves    Essential hypertension    H/O unstable angina 2003   Cardiac cath with multivessel disease referred for CABG   Hyperlipidemia LDL goal <70    Hypertension, benign    Left main coronary artery disease 2003   70%  ostial left main, 100% Cx,; patent, normal RCA   Leg edema, left 04/08/2015   OSA on CPAP    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 Lucas)     Past Surgical History:  Procedure Laterality Date   CARDIAC CATHETERIZATION  01/2002   70% left main, 100 % Cx @ OM; normal RCA   CARDIAC CATHETERIZATION  2004, 2008   Occluded SVG-D1 with antegrade native D1 flow; LM increased from 70-90% with patent LIMA and SVG-OM1; patent RCA ; EF of 45-50% with 2+ MR    Carotid Duplex Ultrasound  09/2012   Less than 49% right carotid; relatively normal left carotid.   CORONARY ARTERY BYPASS GRAFT  01/2002   Dr. Lucas: LIMA-LAD, SVG-OM 1, SVG-D1   FINGER CLOSED REDUCTION Left 08/25/2022   Procedure: CLOSED  REDUCTION  OF THUMB PROXIMAL PHALANX FRACTURE;  Surgeon: Romona Harari, MD;  Location: [facility];  Service: Orthopedics;  Laterality: Left;  REGIONAL AND MAC  100 MIN   MINOR HARDWARE REMOVAL Left 10/13/2022   Procedure: REMOVAL OF DEEP IMPLANT FROM THUMB;  Surgeon: Romona Harari, MD;  Location: [facility];  Service: Orthopedics;  Laterality: Left;   NM MYOVIEW  LTD  11/26/2019   EF 65-70%.  No R WMA.  No ischemia or infarction.   TRANSTHORACIC ECHOCARDIOGRAM  2013   Normal LV function with normal valves.    Family History  Problem Relation Age of Onset   Other Mother        Unknown   Other Father        Unknown   Breast cancer Neg Hx     Social History   Socioeconomic History   Marital status: Married    Spouse name: Not on file   Number of children: Not on file   Years of education: Not on file   Highest education level: Associate degree: occupational, scientist, product/process development, or vocational program  Occupational History   Not on file  Tobacco Use   Smoking status: Never   Smokeless tobacco: Never  Vaping Use   Vaping status: Never Used  Substance and Sexual Activity   Alcohol use: No   Drug use: No   Sexual activity: Not on file  Other  Topics Concern   Not on file  Social History Narrative   78 year old married white woman, mother of 3, grandmother of 4.   Never smoked. Rare social alcohol.   She is a hotel manager around the house, and does lots of the chart work.   Her husband is also a former patient Dr. Maye, and now follows up with Dr. Anner.   Two story home lives with husband [name]   Caffeine prn   Right handed   Social Drivers of Health   Financial Resource Strain: Low Risk  (12/31/2023)   Overall Financial Resource Strain (CARDIA)    Difficulty of Paying Living Expenses: Not very hard  Food Insecurity: No Food Insecurity (12/31/2023)   Hunger Vital Sign  Worried About Programme Researcher, Broadcasting/film/video in the Last Year: Never true    Ran Out of Food in the Last Year: Never true  Transportation Needs: No Transportation Needs (12/31/2023)   PRAPARE - Administrator, Civil Service (Medical): No    Lack of Transportation (Non-Medical): No  Physical Activity: Sufficiently Active (12/31/2023)   Exercise Vital Sign    Days of Exercise per Week: 7 days    Minutes of Exercise per Session: 30 min  Stress: No Stress Concern Present (12/31/2023)   [employer] of Occupational Health - Occupational Stress Questionnaire    Feeling of Stress: Not at all  Social Connections: Unknown (12/31/2023)   Social Connection and Isolation Panel    Frequency of Communication with Friends and Family: More than three times a week    Frequency of Social Gatherings with Friends and Family: Once a week    Attends Religious Services: Not on Marketing Executive or Organizations: No    Attends Engineer, Structural: Not on file    Marital Status: Married  Catering Manager Violence: Not on file    ROS  Per HPI      Objective    BP 138/84   Pulse 66   Temp (!) 97.5 F (36.4 C) (Oral)   Ht 5' 3 (1.6 m)   Wt 137 lb (62.1 kg)   SpO2 98%   BMI 24.27 kg/m   Physical Exam      Assessment &  Plan:   Encounter to establish care  Screening for viral disease -     Hepatitis C antibody; Future  Hyperlipidemia LDL goal <70 Assessment & Plan: Updating lipid panel with labs. She follows with cardiology, Dr. Anner.  Continue rosuvastatin  40 mg and Nexlizet  180-10 mg daily.   Orders: -     Comprehensive metabolic panel with GFR; Future -     Lipid panel; Future  Essential hypertension Assessment & Plan: BP Goal < 130/80. Slightly above goal in office today, at goal on recheck. Continue metoprolol  XL 50 mg, irbesartan-hydrochlorothiazide  300-12.5 daily and felodipine  2.5 mg. No changes at this time. Continue to coordinate care with cardiology.   Orders: -     CBC with Differential/Platelet; Future -     Hemoglobin A1c; Future -     Comprehensive metabolic panel with GFR; Future -     Lipid panel; Future -     TSH; Future  Memory impairment Assessment & Plan: Age-related changes on MRI. Donepezil  discontinued due to irritability. Discussed that PCP prescribing new antidementia medication not appropriate when neurology is already following.  - Discuss alternative medications such as memantine with neurology. - Consider transfer of care to [facility] if preferred as patient and husband are not sure if they would like to continue care at [facility].      Coronary artery disease involving native coronary artery of native heart without angina pectoris Assessment & Plan: Plan from cardiology as she continues to follow with Dr. Anner:   Plan: Meds refilled Converted from clopidogrel  to aspirin  81 mg. Continue current dose of Toprol  100 mg daily, and felodipine  2.5 mg daily for BP/antianginal effect. Continue irbesartan and HCTZ 300-12.5 mg daily for afterload reduction. Continue rosuvastatin  40 mg daily.     Return in about 6 months (around 07/03/2024) for HTN, HLD, memory .   Saddie JULIANNA Sacks, PA-C

## 2024-01-03 NOTE — Assessment & Plan Note (Signed)
 Updating lipid panel with labs. She follows with cardiology, Dr. Anner.  Continue rosuvastatin  40 mg and Nexlizet  180-10 mg daily.

## 2024-01-03 NOTE — Assessment & Plan Note (Signed)
 Age-related changes on MRI. Donepezil  discontinued due to irritability. Discussed that PCP prescribing new antidementia medication not appropriate when neurology is already following.  - Discuss alternative medications such as memantine with neurology. - Consider transfer of care to [facility] if preferred as patient and husband are not sure if they would like to continue care at [facility].

## 2024-01-03 NOTE — Patient Instructions (Addendum)
 VISIT SUMMARY: Today, you came in for a new patient visit and to discuss your medications. We talked about your memory issues, high blood pressure, high cholesterol, heart disease, and sleep apnea.  YOUR PLAN: MEMORY LOSS: You have been experiencing memory issues and had a bad reaction to donepezil . You are currently taking a Brain Health supplement with some improvement. -I recommend calling your neurology office to schedule a follow up appointment to discuss alternative medications, such as Memantine for memory loss. -We will discuss alternative medications such as memantine with the neurologist. -Their office phone number is  [phone]  HYPERTENSION: Your high blood pressure is being managed with your current medications. -Continue taking irbesartan, hydrochlorothiazide , metoprolol , and felodipine  as prescribed.  HYPERLIPIDEMIA: Your high cholesterol is being managed with your current medications. -Continue taking rosuvastatin  and bempedoic acid as prescribed.  CORONARY ARTERY DISEASE: You have a history of coronary artery bypass surgery and your condition is being managed by cardiology. -Continue taking rosuvastatin  and bempedoic acid as prescribed.  OBSTRUCTIVE SLEEP APNEA: You have sleep apnea but are not using your CPAP machine and report good sleep quality. -No changes needed at this time since you are sleeping well.  If you have any problems before your next visit feel free to message me via MyChart (minor issues or questions) or call the office, otherwise you may reach out to schedule an office visit.  Thank you! Saddie Sacks, PA-C

## 2024-01-03 NOTE — Assessment & Plan Note (Signed)
 Plan from cardiology as she continues to follow with Dr. Anner:   Plan: Meds refilled Converted from clopidogrel  to aspirin  81 mg. Continue current dose of Toprol  100 mg daily, and felodipine  2.5 mg daily for BP/antianginal effect. Continue irbesartan and HCTZ 300-12.5 mg daily for afterload reduction. Continue rosuvastatin  40 mg daily.

## 2024-01-03 NOTE — Assessment & Plan Note (Signed)
 BP Goal < 130/80. Slightly above goal in office today, at goal on recheck. Continue metoprolol  XL 50 mg, irbesartan-hydrochlorothiazide  300-12.5 daily and felodipine  2.5 mg. No changes at this time. Continue to coordinate care with cardiology.

## 2024-01-04 ENCOUNTER — Ambulatory Visit: Payer: Self-pay

## 2024-01-04 LAB — COMPREHENSIVE METABOLIC PANEL WITH GFR
ALT: 10 IU/L (ref 0–32)
AST: 13 IU/L (ref 0–40)
Albumin: 4.1 g/dL (ref 3.8–4.8)
Alkaline Phosphatase: 71 IU/L (ref 49–135)
BUN/Creatinine Ratio: 23 (ref 12–28)
BUN: 17 mg/dL (ref 8–27)
Bilirubin Total: 0.6 mg/dL (ref 0.0–1.2)
CO2: 28 mmol/L (ref 20–29)
Calcium: 9.5 mg/dL (ref 8.7–10.3)
Chloride: 103 mmol/L (ref 96–106)
Creatinine, Ser: 0.75 mg/dL (ref 0.57–1.00)
Globulin, Total: 2.2 g/dL (ref 1.5–4.5)
Glucose: 86 mg/dL (ref 70–99)
Potassium: 3.8 mmol/L (ref 3.5–5.2)
Sodium: 143 mmol/L (ref 134–144)
Total Protein: 6.3 g/dL (ref 6.0–8.5)
eGFR: 81 mL/min/1.73 (ref >59)

## 2024-01-04 LAB — TSH: TSH: 1.21 u[IU]/mL (ref 0.450–4.500)

## 2024-01-04 LAB — CBC WITH DIFFERENTIAL/PLATELET
Basophils Absolute: 0 x10E3/uL (ref 0.0–0.2)
Basos: 0 %
EOS (ABSOLUTE): 0.1 x10E3/uL (ref 0.0–0.4)
Eos: 1 %
Hematocrit: 42.6 % (ref 34.0–46.6)
Hemoglobin: 14 g/dL (ref 11.1–15.9)
Immature Grans (Abs): 0 x10E3/uL (ref 0.0–0.1)
Immature Granulocytes: 0 %
Lymphocytes Absolute: 1.3 x10E3/uL (ref 0.7–3.1)
Lymphs: 21 %
MCH: 33.2 pg — ABNORMAL HIGH (ref 26.6–33.0)
MCHC: 32.9 g/dL (ref 31.5–35.7)
MCV: 101 fL — ABNORMAL HIGH (ref 79–97)
Monocytes Absolute: 0.4 x10E3/uL (ref 0.1–0.9)
Monocytes: 6 %
Neutrophils Absolute: 4.5 x10E3/uL (ref 1.4–7.0)
Neutrophils: 72 %
Platelets: 223 x10E3/uL (ref 150–450)
RBC: 4.22 x10E6/uL (ref 3.77–5.28)
RDW: 12.5 % (ref 11.7–15.4)
WBC: 6.3 x10E3/uL (ref 3.4–10.8)

## 2024-01-04 LAB — LIPID PANEL
Chol/HDL Ratio: 2.7 ratio (ref 0.0–4.4)
Cholesterol, Total: 189 mg/dL (ref 100–199)
HDL: 69 mg/dL (ref >39)
LDL Chol Calc (NIH): 89 mg/dL (ref 0–99)
Triglycerides: 184 mg/dL — ABNORMAL HIGH (ref 0–149)
VLDL Cholesterol Cal: 31 mg/dL (ref 5–40)

## 2024-01-04 LAB — HEPATITIS C ANTIBODY: Hep C Virus Ab: NONREACTIVE

## 2024-01-04 LAB — HEMOGLOBIN A1C
Est. average glucose Bld gHb Est-mCnc: 111 mg/dL
Hgb A1c MFr Bld: 5.5 % (ref 4.8–5.6)

## 2024-01-04 NOTE — Telephone Encounter (Signed)
 Called and spoke with patient/ related labwork results from provider.

## 2024-01-18 ENCOUNTER — Other Ambulatory Visit: Payer: Self-pay | Admitting: Physician Assistant

## 2024-01-18 ENCOUNTER — Telehealth: Payer: Self-pay | Admitting: Physician Assistant

## 2024-01-18 MED ORDER — MEMANTINE HCL 5 MG PO TABS: ORAL_TABLET | ORAL | 3 refills | Status: AC

## 2024-01-18 NOTE — Telephone Encounter (Signed)
 Marie isn't on the [facility], left message to call office richard POA.

## 2024-01-18 NOTE — Telephone Encounter (Signed)
 Pt's daughter Earnie  at [phone] called in this morning. Earnie stated that pt is having issues with her memory. Earnie stated that the prescription that was prescribe by Dr. Dina is not working. Earnie is asking is there another prescription the Pt can be prescribe for her memory. Please call. Thanks

## 2024-01-19 NOTE — Telephone Encounter (Signed)
 Spoke with husband and he is going to get the medication and schedule visit. Thanked me for calling.

## 2024-02-10 ENCOUNTER — Ambulatory Visit: Payer: Self-pay

## 2024-02-10 NOTE — Telephone Encounter (Signed)
 FYI Only or Action Required?: FYI only for provider: appointment scheduled on 02/17/24.  Patient was last seen in primary care on 01/03/2024 by Gayle Saddie FALCON, PA-C.  Called Nurse Triage reporting Memory Loss.  Symptoms began several months ago.  Interventions attempted: Prescription medications: Namenda  (daughter thinks patient has been taking).  Symptoms are: gradually worsening.  Triage Disposition: See Within 2 Weeks in Office (overriding See Physician Within 24 Hours)  Patient/caregiver understands and will follow disposition?: Yes            Copied from CRM [id number]. Topic: Clinical - Red Word Triage >> Feb 10, 2024  1:39 PM Pinkey ORN wrote: Red Word that prompted transfer to Nurse Triage: Severe Memory Fog >> Feb 10, 2024  1:42 PM Pinkey ORN wrote: Earnie daughter is calling on behalf of patient, states she's experiencing some severe memory fog. States that the patient had an MRI completed, but it showed no signs of Alzheimer's or Dementia therefor she's wanting to rule out  all possibilities that could be playing a factor into the fogged memory.  Reason for Disposition  [1] Confusion getting worse AND [2] slow onset (days to weeks)  Answer Assessment - Initial Assessment Questions Daughter, Earnie, calling in on patient's behalf concerned about gradually worsening mood and memory problems. She states they are not pleased with the care they received from [facility] and would like a different referral, per PCP note mentioned [facility] as another option. Scheduled with Dr Chandra for follow up appt next week as daughter would also like Vitamin B 12 looked at and discuss her history of OSA with CPAP use as potential factors.   1. MAIN CONCERN OR SYMPTOM:  What is your main concern right now? What questions do you have? What's the main symptom you're worried about? (e.g., confusion, memory loss)     Worsening memory loss, mood swings,  agitation.  2. ONSET:  When did the symptom start (or worsen)? (minutes, hours, days, weeks)     Several months, gradually noticed and has been worsening.  3. BETTER-SAME-WORSE: Are you (the patient) getting better, staying the same, or getting worse compared to the day you (they) were diagnosed or most recent hospital discharge?     Worse.  4. DIAGNOSIS: Was the dementia diagnosed by a doctor? If Yes, ask: When? (e.g., days, months, years ago)     MRI of the brain showed no Alzheimer's or dementia but age related changes. Per chart, patient has memory impairment history.  5. MEDICINES: Has there been any change in medicines recently? (e.g., narcotics, antihistamines, benzodiazepines, etc.)     Namenda , placed on a month ago by neurology.  6. OTHER SYMPTOMS: Are there any other symptoms? (e.g., cough, falling, fever, pain)     1 week ago had stomach bug with vomiting that resolved. Denies any urinary symptoms or fever. Daughter states patient is in great physical shape.  7. SUPPORT: What type of support do you (the patient) have? Note: Document living circumstances and support (e.g., family, nursing home).     Patient lives at home with her husband.  Protocols used: Dementia Symptoms and Questions-A-AH

## 2024-02-16 ENCOUNTER — Other Ambulatory Visit: Payer: Self-pay | Admitting: Cardiology

## 2024-02-17 ENCOUNTER — Encounter: Payer: Self-pay | Admitting: Family Medicine

## 2024-02-17 ENCOUNTER — Ambulatory Visit: Admitting: Family Medicine

## 2024-02-17 VITALS — BP 179/72 | HR 71 | Ht 63.0 in | Wt 134.4 lb

## 2024-02-17 DIAGNOSIS — R413 Other amnesia: Secondary | ICD-10-CM

## 2024-02-17 MED ORDER — REXULTI 0.5 MG PO TABS: 1.0000 | ORAL_TABLET | Freq: Every day | ORAL | 2 refills | Status: AC

## 2024-02-17 NOTE — Progress Notes (Signed)
° °  Established Patient Office Visit  Subjective   Patient ID: [name], female    DOB: 1946-01-07  Age: 78 y.o. MRN: [id number]  Chief Complaint  Patient presents with   Memory Loss    HPI  Subjective - Memory concerns, per husband. - Reports worsening memory despite treatment. - Recently started on Namenda  one month ago, in addition to Donepezil . - Reports increased anger and irritability since starting Namenda . - Denies nausea or vomiting. No reported upset stomach.  Medications Donepezil , Namenda .  PMH, PSH, FH, Social Hx Seen by neurology in July. MRI at that time was normal. Labs were normal. No other significant history mentioned.  ROS Constitutional: No fever, chills. GI: Denies nausea, vomiting. Neuro: Reports memory decline and increased anger/irritability.    The 10-year ASCVD risk score (Arnett DK, et al., 2019) is: 49.2%  Health Maintenance Due  Topic Date Due   Medicare Annual Wellness (AWV)  Never done   COVID-19 Vaccine (3 - Pfizer risk series) 06/06/2019      Objective:     BP (!) 179/72   Pulse 71   Ht 5' 3 (1.6 m)   Wt 134 lb 6.4 oz (61 kg)   SpO2 100%   BMI 23.81 kg/m    Physical Exam Gen: alert, oriented to self, situation.  Accompanied by husband Pulm: no respiratory distress Psych: pleasant affect   No results found for any visits on 02/17/24.      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 Patient presents with worsening memory and new onset agitation/anger, possibly exacerbated by recent addition of Namenda . Previous workup by neurology including MRI was unremarkable. Current medications are Donepezil  and Namenda . Plan is to rule out reversible causes and address agitation. - Will order labs to rule out reversible causes of dementia, including B12, syphilis, and other vitamin deficiencies. - Discussed medication options for agitation. Offered Rexulti, a  medication for agitation associated with dementia. Counseled that it does not improve memory. A prescription will be sent, but it will require prior authorization and may take 1-2 weeks to be available. - Provided information on the GUIDE program, a Medicare pilot program that provides resources and support for caregivers of patients with dementia.  Orders: -     B12 and Folate Panel -     RPR W/RFLX TO RPR TITER, TREPONEMAL AB, SCREEN AND DIAGNOSIS -     VITAMIN D 25 Hydroxy (Vit-D Deficiency, Fractures) -     Vitamin B6 -     Vitamin B1  Other orders -     Rexulti; Take 1 tablet (0.5 mg total) by mouth daily.  Dispense: 30 tablet; Refill: 2     Return in about 4 weeks (around 03/16/2024) for dm, htn.    Toribio MARLA Slain, MD

## 2024-02-17 NOTE — Assessment & Plan Note (Signed)
 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 Patient presents with worsening memory and new onset agitation/anger, possibly exacerbated by recent addition of Namenda . Previous workup by neurology including MRI was unremarkable. Current medications are Donepezil  and Namenda . Plan is to rule out reversible causes and address agitation. - Will order labs to rule out reversible causes of dementia, including B12, syphilis, and other vitamin deficiencies. - Discussed medication options for agitation. Offered Rexulti, a medication for agitation associated with dementia. Counseled that it does not improve memory. A prescription will be sent, but it will require prior authorization and may take 1-2 weeks to be available. - Provided information on the GUIDE program, a Medicare pilot program that provides resources and support for caregivers of patients with dementia.

## 2024-02-17 NOTE — Patient Instructions (Signed)
 It was nice to see you today,  We addressed the following topics today: - I will order blood tests today to check for any reversible causes of your memory issues. - I have sent a prescription for Rexulti to your pharmacy. This is a new medication to help with the agitation and anger. It may take a week or two for your insurance to approve it. - I have provided you with information about the GUIDE program, which can offer support for you and your family.  Have a great day,  Rolan Slain, MD

## 2024-02-20 ENCOUNTER — Ambulatory Visit: Payer: Self-pay | Admitting: Family Medicine

## 2024-02-23 LAB — VITAMIN D 25 HYDROXY (VIT D DEFICIENCY, FRACTURES): Vit D, 25-Hydroxy: 39.8 ng/mL (ref 30.0–100.0)

## 2024-02-23 LAB — VITAMIN B1: Thiamine: 135.3 nmol/L (ref 66.5–200.0)

## 2024-02-23 LAB — B12 AND FOLATE PANEL
Folate: 19.7 ng/mL (ref >3.0)
Vitamin B-12: 552 pg/mL (ref 232–1245)

## 2024-02-23 LAB — VITAMIN B6: Vitamin B6: 6.1 ug/L (ref 3.4–65.2)

## 2024-02-23 LAB — SYPHILIS: RPR W/REFLEX TO RPR TITER AND TREPONEMAL ANTIBODIES, TRADITIONAL SCREENING AND DIAGNOSIS ALGORITHM: RPR Ser Ql: NONREACTIVE

## 2024-02-27 ENCOUNTER — Telehealth: Payer: Self-pay

## 2024-02-27 NOTE — Progress Notes (Signed)
 Called pt spoke to husband he is advised of her lab results

## 2024-02-27 NOTE — Telephone Encounter (Signed)
 Copied from CRM #[id number]. Topic: Clinical - Lab/Test Results >> Feb 27, 2024  9:56 AM Rea ORN wrote: Reason for CRM: Pt daughter Earnie called to be advised of lab results. I advised 02/20/24 encounters results.

## 2024-02-27 NOTE — Telephone Encounter (Signed)
 Copied from CRM #[id number]. Topic: Clinical - Lab/Test Results >> Feb 27, 2024  9:56 AM Rea ORN wrote: Reason for CRM: Pt daughter Earnie called to be advised of lab results. I advised 02/20/24 encounters results.  Results were given to by Rea Pouch.

## 2024-03-13 ENCOUNTER — Telehealth: Payer: Self-pay | Admitting: Cardiology

## 2024-03-13 NOTE — Telephone Encounter (Signed)
 Spoke with Earnie. Earnie states her mothers memory has been declining. She has taken her to neuro (see Epic) and they have stated to Earnie it may be vascular in nature. Earnie requested a appointment. Pt is also over due for follow up. Appt set for 03/14/24 with Orren Fabry. Myles Earnie I would send over to Dr Anner and Orren for review before appointment. Earnie is very worried about her mother and upset Neuro was not able to help.

## 2024-03-13 NOTE — Telephone Encounter (Signed)
 Pt's daughter requesting a c/b regarding info she received from pt's Neurologist. Please advise

## 2024-03-14 ENCOUNTER — Ambulatory Visit: Admitting: Physician Assistant

## 2024-03-14 NOTE — Telephone Encounter (Signed)
 We can see her.  [facility] was not sure that we get a bed offer much assistance either.
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
## 2024-03-20 ENCOUNTER — Ambulatory Visit: Attending: Physician Assistant | Admitting: Physician Assistant

## 2024-03-20 ENCOUNTER — Encounter: Payer: Self-pay | Admitting: Physician Assistant

## 2024-03-20 VITALS — BP 160/70 | HR 66 | Ht 63.0 in | Wt 137.0 lb

## 2024-03-20 DIAGNOSIS — Z951 Presence of aortocoronary bypass graft: Secondary | ICD-10-CM

## 2024-03-20 DIAGNOSIS — E785 Hyperlipidemia, unspecified: Secondary | ICD-10-CM | POA: Diagnosis not present

## 2024-03-20 DIAGNOSIS — I25810 Atherosclerosis of coronary artery bypass graft(s) without angina pectoris: Secondary | ICD-10-CM | POA: Diagnosis not present

## 2024-03-20 DIAGNOSIS — I10 Essential (primary) hypertension: Secondary | ICD-10-CM

## 2024-03-20 DIAGNOSIS — R600 Localized edema: Secondary | ICD-10-CM | POA: Diagnosis not present

## 2024-03-20 DIAGNOSIS — E782 Mixed hyperlipidemia: Secondary | ICD-10-CM

## 2024-03-20 NOTE — Patient Instructions (Signed)
 Medication Instructions:  Please take 3 (three) tablets of your fish oil  once a day  3 memory medications:  Brexpiprazole  (REXULTI ) Donepezil  (ARICEPT ) Memantine  (NAMENDA )  *If you need a refill on your cardiac medications before your next appointment, please call your pharmacy*  Lab Work: 3 months repeat lipid panel with your pcp If you have labs (blood work) drawn today and your tests are completely normal, you will receive your results only by: MyChart Message (if you have MyChart) OR A paper copy in the mail If you have any lab test that is abnormal or we need to change your treatment, we will call you to review the results.  Testing/Procedures: None ordered  Follow-Up: At [facility], you and your health needs are our priority.  As part of our continuing mission to provide you with exceptional heart care, our providers are all part of one team.  This team includes your primary Cardiologist (physician) and Advanced Practice Providers or APPs (Physician Assistants and Nurse Practitioners) who all work together to provide you with the care you need, when you need it.  Your next appointment:   1 year(s)  Provider:   Alm Clay, MD or Orren Fabry, PA   We recommend signing up for the patient portal called MyChart.  Sign up information is provided on this After Visit Summary.  MyChart is used to connect with patients for Virtual Visits (Telemedicine).  Patients are able to view lab/test results, encounter notes, upcoming appointments, etc.  Non-urgent messages can be sent to your provider as well.   To learn more about what you can do with MyChart, go to [email or web address].   Other Instructions Please check your blood pressure daily for two weeks, then contact the office with your readings  Please contact the office with your readings either by phone, by dropping it off in person, or by sending it through MyChart.   Be sure to check your blood pressure one to  two hours after taking your medications.  Avoid the following for 30 minutes before checking your blood pressure: No caffeine No alcohol No eating No smoking  No exercise  Five minutes before checking your blood pressure: Use the restroom Sit up straight in a chair with your back supported and feet flat on the floor Remain quiet and do not talk

## 2024-03-22 ENCOUNTER — Ambulatory Visit

## 2024-03-22 VITALS — BP 130/73 | Ht 63.0 in

## 2024-03-22 DIAGNOSIS — R413 Other amnesia: Secondary | ICD-10-CM

## 2024-03-22 DIAGNOSIS — I10 Essential (primary) hypertension: Secondary | ICD-10-CM | POA: Diagnosis not present

## 2024-03-22 DIAGNOSIS — Z23 Encounter for immunization: Secondary | ICD-10-CM | POA: Diagnosis not present

## 2024-03-22 NOTE — Assessment & Plan Note (Signed)
 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 Managed with donepezil , memantine , and Rexulti . No significant improvement in acute agitation / sundowning with Rexulti  after one month. Donepezil  refill pending. - Continue Rexulti  0.5 mg for four more weeks before considering dose adjustment. - Ensure donepezil  is refilled and taken as prescribed. - Reassess in four weeks to consider increasing Rexulti  dose if no improvemet

## 2024-03-22 NOTE — Progress Notes (Signed)
 "  Established Patient Office Visit  Subjective   Patient ID: [name], female    DOB: April 01, 1945  Age: 79 y.o. MRN: [id number]  Chief Complaint  Patient presents with   Medical Management of Chronic Issues    HPI  History of Present Illness   [name] is a 79 year old female who presents for follow-up regarding her medication regimen and recent lab results.  Neurocognitive symptoms and management - On Rexulti  for approximately four weeks with no noticeable changes in symptoms - indicated cardiologist advised waiting ninety days for medication effect, as response varies among individuals - On memantine  for cognitive impairment - Not currently taking donepezil  due to supply issue; pharmacy contacted for refill and awaiting availability  Laboratory findings - Recent laboratory results from late October showed normal thyroid function - LDL cholesterol within target range - Mildly elevated triglycerides - Normal kidney and liver function - Hemoglobin A1c was 5.5 - Blood counts within normal limits - Vitamin B12, folate, B1, B6, and vitamin D  levels within normal ranges  Blood pressure monitoring - Blood pressure typically monitored at home        ROS Per HPI.    Objective:     BP 130/73   Ht 5' 3 (1.6 m)   BMI 24.27 kg/m    Physical Exam Constitutional:      General: She is not in acute distress.    Appearance: Normal appearance.  Cardiovascular:     Rate and Rhythm: Normal rate and regular rhythm.     Heart sounds: Normal heart sounds. No murmur heard.    No friction rub. No gallop.  Pulmonary:     Effort: Pulmonary effort is normal. No respiratory distress.     Breath sounds: Normal breath sounds.  Musculoskeletal:        General: No swelling.  Skin:    General: Skin is warm and dry.  Neurological:     General: No focal deficit present.     Mental Status: She is alert.  Psychiatric:        Mood and Affect: Mood normal.        Behavior:  Behavior normal.        Thought Content: Thought content normal.      No results found for any visits on 03/22/24.  Last CBC Lab Results  Component Value Date   WBC 6.3 01/03/2024   HGB 14.0 01/03/2024   HCT 42.6 01/03/2024   MCV 101 (H) 01/03/2024   MCH 33.2 (H) 01/03/2024   RDW 12.5 01/03/2024   PLT 223 01/03/2024   Last metabolic panel Lab Results  Component Value Date   GLUCOSE 86 01/03/2024   NA 143 01/03/2024   K 3.8 01/03/2024   CL 103 01/03/2024   CO2 28 01/03/2024   BUN 17 01/03/2024   CREATININE 0.75 01/03/2024   EGFR 81 01/03/2024   CALCIUM  9.5 01/03/2024   PROT 6.3 01/03/2024   ALBUMIN 4.1 01/03/2024   LABGLOB 2.2 01/03/2024   AGRATIO 2.2 03/11/2021   BILITOT 0.6 01/03/2024   ALKPHOS 71 01/03/2024   AST 13 01/03/2024   ALT 10 01/03/2024   ANIONGAP 11 08/24/2022   Last lipids Lab Results  Component Value Date   CHOL 189 01/03/2024   HDL 69 01/03/2024   LDLCALC 89 01/03/2024   TRIG 184 (H) 01/03/2024   CHOLHDL 2.7 01/03/2024   Last hemoglobin A1c Lab Results  Component Value Date   HGBA1C 5.5 01/03/2024  Last thyroid functions Lab Results  Component Value Date   TSH 1.210 01/03/2024   Last vitamin D  Lab Results  Component Value Date   VD25OH 39.8 02/17/2024      The 10-year ASCVD risk score (Arnett DK, et al., 2019) is: 29.9%    Assessment & Plan:   Encounter for vaccination -     Flu vaccine HIGH DOSE PF(Fluzone Trivalent)  Memory impairment Assessment & Plan: Dementia, 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 Managed with donepezil , memantine , and Rexulti . No significant improvement in acute agitation / sundowning with Rexulti  after one month. Donepezil  refill pending. - Continue Rexulti  0.5 mg for four more weeks before considering dose adjustment. - Ensure donepezil  is refilled and taken as prescribed. - Reassess in four weeks to consider increasing Rexulti  dose if no improvemet   Essential hypertension Assessment &  Plan: BP Goal <130/80. Stable at goal today. Continue metoprolol  XL 50 mg, irbesartan-hydrochlorothiazide  300-12.5 daily and felodipine  2.5 mg. No changes at this time. Continue to coordinate care with cardiology. - Monitor blood pressure at home a couple of times a week and record readings. - Report home blood pressure readings to cardiology and primary care for potential adjustments.       Return in about 6 weeks (around 05/03/2024) for HTN, HLD, memory.    Saddie JULIANNA Sacks, PA-C "

## 2024-03-22 NOTE — Patient Instructions (Signed)
 VISIT SUMMARY: During your visit, we reviewed your medication regimen, recent lab results, and discussed your neurocognitive symptoms. We also addressed your elevated triglycerides and blood pressure monitoring.  YOUR PLAN: ALZHEIMER'S DISEASE: You are being treated for Alzheimer's disease with donepezil , memantine , and Rexulti . There has been no significant improvement with Rexulti  after one month. -Continue taking Rexulti  for four more weeks before considering a dose adjustment. -Ensure your donepezil  prescription is refilled and take it as prescribed. -We will reassess your condition in four weeks to consider increasing the Rexulti  dose if there is no improvement.  HYPERTRIGLYCERIDEMIA: Your triglyceride levels are mildly elevated. -Continue taking fish oil  supplements with 1200 mg daily. -We will monitor your triglyceride levels in future lab work.  HYPERTENSION: Your blood pressure is slightly elevated, likely due to white coat syndrome. -Monitor your blood pressure at home a couple of times a week and record the readings. -Report your home blood pressure readings to your cardiologist and primary care doctor for potential adjustments.  IMMUNIZATION STATUS: You have not received the influenza vaccination for the current year. -Please get your influenza vaccination for this year.  If you have any problems before your next visit feel free to message me via MyChart (minor issues or questions) or call the office, otherwise you may reach out to schedule an office visit.  Thank you! Saddie Sacks, PA-C

## 2024-03-22 NOTE — Assessment & Plan Note (Signed)
 BP Goal <130/80. Stable at goal today. Continue metoprolol  XL 50 mg, irbesartan-hydrochlorothiazide  300-12.5 daily and felodipine  2.5 mg. No changes at this time. Continue to coordinate care with cardiology. - Monitor blood pressure at home a couple of times a week and record readings. - Report home blood pressure readings to cardiology and primary care for potential adjustments.

## 2024-04-04 ENCOUNTER — Ambulatory Visit: Admitting: Cardiology

## 2024-07-03 ENCOUNTER — Ambulatory Visit
